# Patient Record
Sex: Female | Born: 1990 | Race: Black or African American | Hispanic: No | Marital: Single | Smoking: Never smoker
Health system: Southern US, Community
[De-identification: ages and names within clinical notes are randomized; demographics above are authoritative.]

## PROBLEM LIST (undated history)

## (undated) ENCOUNTER — Inpatient Hospital Stay (HOSPITAL_COMMUNITY): Payer: Self-pay

## (undated) DIAGNOSIS — J45909 Unspecified asthma, uncomplicated: Secondary | ICD-10-CM

## (undated) HISTORY — PX: NO PAST SURGERIES: SHX2092

---

## 2012-09-05 ENCOUNTER — Encounter (HOSPITAL_COMMUNITY): Payer: Self-pay | Admitting: Emergency Medicine

## 2012-09-05 ENCOUNTER — Emergency Department (HOSPITAL_COMMUNITY)
Admission: EM | Admit: 2012-09-05 | Discharge: 2012-09-05 | Disposition: A | Discharge status: Home / Self Care | Payer: Self-pay | Attending: Emergency Medicine | Admitting: Emergency Medicine

## 2012-09-05 DIAGNOSIS — Z760 Encounter for issue of repeat prescription: Secondary | ICD-10-CM | POA: Insufficient documentation

## 2012-09-05 DIAGNOSIS — Z888 Allergy status to other drugs, medicaments and biological substances status: Secondary | ICD-10-CM | POA: Insufficient documentation

## 2012-09-05 DIAGNOSIS — J45909 Unspecified asthma, uncomplicated: Secondary | ICD-10-CM | POA: Insufficient documentation

## 2012-09-05 HISTORY — DX: Unspecified asthma, uncomplicated: J45.909

## 2012-09-05 MED ORDER — BUDESONIDE 180 MCG/ACT IN AEPB: 1.0000 | INHALATION_SPRAY | Freq: Two times a day (BID) | RESPIRATORY_TRACT | Status: DC

## 2012-09-05 MED ORDER — ALBUTEROL SULFATE HFA 108 (90 BASE) MCG/ACT IN AERS: 2.0000 | INHALATION_SPRAY | Freq: Four times a day (QID) | RESPIRATORY_TRACT | Status: DC | PRN

## 2012-09-05 NOTE — ED Provider Notes (Signed)
History    This chart was scribed for [employer]. Rubin Payor, MD, MD by Smitty Pluck. The patient was seen in room TR10C and the patient's care was started at 9:17AM.   CSN: [id number]  Arrival date & time 09/05/12  0847      Chief Complaint  Patient presents with  . Asthma  . Medication Refill    (Consider location/radiation/quality/duration/timing/severity/associated sxs/prior treatment) Patient is a 21 y.o. female presenting with asthma. The history is provided by the patient. No language interpreter was used.  Asthma Pertinent negatives include no shortness of breath.   Tracy Stone is a 21 y.o. female who presents to the Emergency Department complaining of moderate asthma attack onset 2 months ago. Pt reports that she has had asthma attacks every morning. She states that she lost her albuterol inhaler 2 days ago and she needs a refill. Pt has used flovent without relief. Pt denies fever, n/v/d, cough and any other pain.   Past Medical History  Diagnosis Date  . Asthma     History reviewed. No pertinent past surgical history.  No family history on file.  History  Substance Use Topics  . Smoking status: Passive Smoke Exposure - Never Smoker  . Smokeless tobacco: Not on file  . Alcohol Use: No    OB History    Grav Para Term Preterm Abortions TAB SAB Ect Mult Living                  Review of Systems  Constitutional: Negative for fever and chills.  Respiratory: Negative for shortness of breath.   Gastrointestinal: Negative for nausea and vomiting.  Neurological: Negative for weakness.    Allergies  Ibuprofen  Home Medications   Current Outpatient Rx  Name Route Sig Dispense Refill  . ALBUTEROL SULFATE HFA 108 (90 BASE) MCG/ACT IN AERS Inhalation Inhale 2 puffs into the lungs every 6 (six) hours as needed. For shortness of breath 6.7 g 0  . BUDESONIDE 180 MCG/ACT IN AEPB Inhalation Inhale 1 puff into the lungs 2 (two) times daily. 1 Inhaler 0    BP  129/78  Pulse 90  Temp 98.3 F (36.8 C) (Oral)  Resp 18  SpO2 97%  LMP 08/12/2012  Physical Exam  Nursing note and vitals reviewed. Constitutional: She is oriented to person, place, and time. She appears well-developed and well-nourished. No distress.  HENT:  Head: Normocephalic and atraumatic.  Eyes: EOM are normal.  Neck: Neck supple. No tracheal deviation present.  Cardiovascular: Normal rate.   Pulmonary/Chest: Effort normal and breath sounds normal. No respiratory distress. She has no wheezes. She has no rales.  Musculoskeletal: Normal range of motion.  Neurological: She is alert and oriented to person, place, and time.  Skin: Skin is warm and dry.  Psychiatric: She has a normal mood and affect. Her behavior is normal.    ED Course  Procedures (including critical care time)   COORDINATION OF CARE: 9:20 AM Discussed ED treatment with pt     Labs Reviewed - No data to display No results found.   1. Asthma   2. Medication refill       MDM  Patient with shortness of breath. States she has daily asthma attacks and is out of her albuterol. Lungs are clear now. Doubt severe infection. She was on Flovent, however she is poorly controlled  Pulmicort has been added. She'll be discharged home   I personally performed the services described in this documentation, which was scribed in  my presence. The recorded information has been reviewed and considered.  Juliet Rude. Rubin Payor, MD 09/05/12 1026

## 2012-09-05 NOTE — ED Notes (Signed)
Received pt via EMS, Pt reports 2 month history of waking up everyday having an asthma attack. Pt reports uses inhaler at home that relieves the attack. Pt lost her inhaler 2 days ago and is requesting a prescription for another one. Pt talking in complete sentences without difficulty. Skin w/d color good. Lung sounds clear. Pt given albuterol neb treatment by EMS PTA.

## 2012-11-30 NOTE — L&D Delivery Note (Signed)
Delivery Note At 9:14 PM a viable female was delivered via Vaginal, Spontaneous Delivery (Presentation: Right Occiput Posterior).  APGAR: 8, 9; weight pending.   Placenta status: Intact, Spontaneous.  Cord: 3 vessels with the following complications: None.  Cord pH: n/a  Anesthesia: Epidural  Episiotomy: None Lacerations: None Suture Repair: n/a Est. Blood Loss (mL): 500  Mom to postpartum.  Baby to nursery-stable.  Napoleon Form 06/28/2013, 9:54 PM

## 2013-01-04 ENCOUNTER — Emergency Department (HOSPITAL_COMMUNITY)
Admission: EM | Admit: 2013-01-04 | Discharge: 2013-01-04 | Disposition: A | Discharge status: Home / Self Care | Payer: Self-pay | Attending: Emergency Medicine | Admitting: Emergency Medicine

## 2013-01-04 ENCOUNTER — Encounter (HOSPITAL_COMMUNITY): Payer: Self-pay | Admitting: Emergency Medicine

## 2013-01-04 DIAGNOSIS — J45909 Unspecified asthma, uncomplicated: Secondary | ICD-10-CM | POA: Insufficient documentation

## 2013-01-04 DIAGNOSIS — K0889 Other specified disorders of teeth and supporting structures: Secondary | ICD-10-CM

## 2013-01-04 DIAGNOSIS — Z79899 Other long term (current) drug therapy: Secondary | ICD-10-CM | POA: Insufficient documentation

## 2013-01-04 DIAGNOSIS — K089 Disorder of teeth and supporting structures, unspecified: Secondary | ICD-10-CM | POA: Insufficient documentation

## 2013-01-04 DIAGNOSIS — H9209 Otalgia, unspecified ear: Secondary | ICD-10-CM | POA: Insufficient documentation

## 2013-01-04 DIAGNOSIS — Z760 Encounter for issue of repeat prescription: Secondary | ICD-10-CM | POA: Insufficient documentation

## 2013-01-04 MED ORDER — TRAMADOL HCL 50 MG PO TABS: 50.0000 mg | ORAL_TABLET | Freq: Four times a day (QID) | ORAL | Status: DC | PRN

## 2013-01-04 MED ORDER — ALBUTEROL SULFATE HFA 108 (90 BASE) MCG/ACT IN AERS
2.0000 | INHALATION_SPRAY | RESPIRATORY_TRACT | Status: DC | PRN
  Administered 2013-01-04: 2 via RESPIRATORY_TRACT
  Filled 2013-01-04: qty 6.7

## 2013-01-04 MED ORDER — PENICILLIN V POTASSIUM 500 MG PO TABS: 500.0000 mg | ORAL_TABLET | Freq: Three times a day (TID) | ORAL | Status: DC

## 2013-01-04 NOTE — ED Provider Notes (Signed)
History     CSN: [id number]  Arrival date & time 01/04/13  0948   First MD Initiated Contact with Patient 01/04/13 1007      Chief Complaint  Patient presents with  . Facial Pain  . Otalgia    (Consider location/radiation/quality/duration/timing/severity/associated sxs/prior treatment) HPI  22 year old female presents complaining of facial pain. Patient reports for the past week she has gradual onset of pain to the left side of her face radiates up to her ear. Describe pain as an achy throbbing sensation, constant, sometimes affecting her teeth can sometimes affect the cheek. Pain is of mild to moderate in severity, not improved with taking Tylenol. She remains the pain worse. Patient denies fever, runny nose, sneezing, hearing changes, sore throat, trouble swallowing, chest pain, shortness of breath. There has been no recent trauma. Patient does have a history of asthma and takes albuterol inhaler as needed. Reports that she has been without asthma medication for a week and request for a refill.     Past Medical History  Diagnosis Date  . Asthma     History reviewed. No pertinent past surgical history.  No family history on file.  History  Substance Use Topics  . Smoking status: Passive Smoke Exposure - Never Smoker  . Smokeless tobacco: Not on file  . Alcohol Use: No    OB History    Grav Para Term Preterm Abortions TAB SAB Ect Mult Living                  Review of Systems  Constitutional:       10 Systems reviewed and all are negative for acute change except as noted in the HPI.     Allergies  Ibuprofen  Home Medications   Current Outpatient Rx  Name  Route  Sig  Dispense  Refill  . ACETAMINOPHEN 500 MG PO TABS   Oral   Take 1,000 mg by mouth every 6 (six) hours as needed. For pain.         . ALBUTEROL SULFATE HFA 108 (90 BASE) MCG/ACT IN AERS   Inhalation   Inhale 2 puffs into the lungs every 6 (six) hours as needed. For shortness of breath   6.7  g   0     BP 123/79  Pulse 90  Temp 98.2 F (36.8 C) (Oral)  Resp 16  SpO2 96%  Physical Exam  Nursing note and vitals reviewed. Constitutional: She appears well-developed and well-nourished. No distress.  HENT:  Head: Atraumatic.  Right Ear: External ear normal.  Left Ear: External ear normal.  Nose: Nose normal.  Mouth/Throat: Oropharynx is clear and moist. No oropharyngeal exudate.       Mouth:  Mild tenderness noted to second premolar on the left mandibular region with small cavitation.  No obvious abscess or gingivitis noted.  No evidence of peritonsillar abscess, Ludwig's angina or other evidence of deep tissue infection.  No trismus or rash.   Eyes: Conjunctivae normal are normal.  Neck: Normal range of motion. Neck supple.  Cardiovascular: Normal rate and regular rhythm.   Pulmonary/Chest: Effort normal and breath sounds normal. No respiratory distress. She has no wheezes.  Lymphadenopathy:    She has no cervical adenopathy.  Neurological: She is alert.  Skin: Skin is warm. No rash noted.  Psychiatric: She has a normal mood and affect.    ED Course  Procedures (including critical care time)  Labs Reviewed - No data to display No results found.  No diagnosis found.  10:27 AM Patient was seen and evaluate by me for facial pain. Pain appears to be related to dental pain. No evidence of deep tissue infection. Patient appears nontoxic. Patient doesn't have symptoms suggestive of upper respiratory infection. Plan to give patient pain medication, antibiotic, and dentist referral.  Patient does have history of asthma and is now off her inhaler. Albuterol inhaler were given here in the ER. Resource given so with patient to follow up with her primary care provider for further management. Return precautions given.   BP 123/79  Pulse 90  Temp 98.2 F (36.8 C) (Oral)  Resp 16  SpO2 96%  1. Dental pain 2. Medication refill  MDM          Fayrene Helper,  PA-C 01/04/13 1028

## 2013-01-04 NOTE — ED Provider Notes (Signed)
Medical screening examination/treatment/procedure(s) were performed by non-physician practitioner and as supervising physician I was immediately available for consultation/collaboration.   Carleene Cooper III, MD 01/04/13 [phone]

## 2013-01-04 NOTE — ED Notes (Signed)
Onset one week ago pain left face radiating to left ear denies trauma.  Airway intact bilateral equal chest rise and fall.

## 2013-01-31 ENCOUNTER — Encounter (HOSPITAL_COMMUNITY): Payer: Self-pay | Admitting: Emergency Medicine

## 2013-01-31 DIAGNOSIS — Z3201 Encounter for pregnancy test, result positive: Secondary | ICD-10-CM | POA: Insufficient documentation

## 2013-01-31 DIAGNOSIS — J45909 Unspecified asthma, uncomplicated: Secondary | ICD-10-CM | POA: Insufficient documentation

## 2013-01-31 DIAGNOSIS — O9989 Other specified diseases and conditions complicating pregnancy, childbirth and the puerperium: Secondary | ICD-10-CM | POA: Insufficient documentation

## 2013-01-31 DIAGNOSIS — R109 Unspecified abdominal pain: Secondary | ICD-10-CM | POA: Insufficient documentation

## 2013-01-31 LAB — CBC WITH DIFFERENTIAL/PLATELET
Basophils Absolute: 0 10*3/uL (ref 0.0–0.1)
Basophils Relative: 0 % (ref 0–1)
Eosinophils Absolute: 0.2 10*3/uL (ref 0.0–0.7)
Eosinophils Relative: 2 % (ref 0–5)
HCT: 34.4 % — ABNORMAL LOW (ref 36.0–46.0)
Hemoglobin: 11.9 g/dL — ABNORMAL LOW (ref 12.0–15.0)
Lymphocytes Relative: 29 % (ref 12–46)
Lymphs Abs: 3 10*3/uL (ref 0.7–4.0)
MCH: 22 pg — ABNORMAL LOW (ref 26.0–34.0)
MCHC: 34.6 g/dL (ref 30.0–36.0)
MCV: 63.7 fL — ABNORMAL LOW (ref 78.0–100.0)
Monocytes Absolute: 0.5 10*3/uL (ref 0.1–1.0)
Monocytes Relative: 5 % (ref 3–12)
Neutro Abs: 6.5 10*3/uL (ref 1.7–7.7)
Neutrophils Relative %: 64 % (ref 43–77)
Platelets: 292 10*3/uL (ref 150–400)
RBC: 5.4 MIL/uL — ABNORMAL HIGH (ref 3.87–5.11)
RDW: 15 % (ref 11.5–15.5)
WBC: 10.2 10*3/uL (ref 4.0–10.5)

## 2013-01-31 LAB — COMPREHENSIVE METABOLIC PANEL
AST: 15 U/L (ref 0–37)
Albumin: 3.8 g/dL (ref 3.5–5.2)
Calcium: 9.6 mg/dL (ref 8.4–10.5)
Chloride: 105 mEq/L (ref 96–112)
Creatinine, Ser: 0.69 mg/dL (ref 0.50–1.10)
Total Bilirubin: 0.2 mg/dL — ABNORMAL LOW (ref 0.3–1.2)
Total Protein: 7.9 g/dL (ref 6.0–8.3)

## 2013-01-31 LAB — URINALYSIS, ROUTINE W REFLEX MICROSCOPIC
Bilirubin Urine: NEGATIVE
Glucose, UA: NEGATIVE mg/dL
Hgb urine dipstick: NEGATIVE
Ketones, ur: NEGATIVE mg/dL
Leukocytes, UA: NEGATIVE
Nitrite: NEGATIVE
Protein, ur: NEGATIVE mg/dL
Specific Gravity, Urine: 1.01 (ref 1.005–1.030)
Urobilinogen, UA: 0.2 mg/dL (ref 0.0–1.0)
pH: 5.5 (ref 5.0–8.0)

## 2013-01-31 NOTE — ED Notes (Signed)
Pt c/o RLQ pain onset 3 hrs ago.  Denies nausea, vomiting or urinary symptoms.  Pt also denies vag. discharge

## 2013-02-01 ENCOUNTER — Emergency Department (HOSPITAL_COMMUNITY): Payer: Medicaid Other

## 2013-02-01 ENCOUNTER — Emergency Department (HOSPITAL_COMMUNITY)
Admission: EM | Admit: 2013-02-01 | Discharge: 2013-02-01 | Disposition: A | Discharge status: Home / Self Care | Payer: Medicaid Other | Attending: Emergency Medicine | Admitting: Emergency Medicine

## 2013-02-01 DIAGNOSIS — R109 Unspecified abdominal pain: Secondary | ICD-10-CM

## 2013-02-01 LAB — HCG, QUANTITATIVE, PREGNANCY: hCG, Beta Chain, Quant, S: 15017 m[IU]/mL — ABNORMAL HIGH (ref <5)

## 2013-02-01 MED ORDER — ALBUTEROL SULFATE HFA 108 (90 BASE) MCG/ACT IN AERS
1.0000 | INHALATION_SPRAY | RESPIRATORY_TRACT | Status: DC | PRN
  Administered 2013-02-01: 2 via RESPIRATORY_TRACT
  Filled 2013-02-01: qty 6.7

## 2013-02-01 NOTE — ED Provider Notes (Signed)
History     CSN: [id number]  Arrival date & time 01/31/13  2142   First MD Initiated Contact with Patient 02/01/13 0101      Chief Complaint  Patient presents with  . Abdominal Pain    (Consider location/radiation/quality/duration/timing/severity/associated sxs/prior treatment) HPI Comments: Patient presents with a several hour history of right lower abdominal pain that is crampy in nature.  She denies any fevers or chills.  No nausea, vomiting, or diarrhea.  No vaginal bleeding or discharge.  She has not had a period in several months and recall when the last was.  She admits to the possibility of pregnant.  Patient is a 22 y.o. female presenting with abdominal pain. The history is provided by the patient.  Abdominal Pain Pain location:  RUQ and RLQ Pain quality: cramping   Pain radiates to:  Does not radiate Onset quality:  Sudden Duration:  3 hours Timing:  Constant Progression:  Worsening Chronicity:  New Relieved by:  Nothing Worsened by:  Nothing tried Ineffective treatments:  None tried   Past Medical History  Diagnosis Date  . Asthma     History reviewed. No pertinent past surgical history.  No family history on file.  History  Substance Use Topics  . Smoking status: Passive Smoke Exposure - Never Smoker  . Smokeless tobacco: Not on file  . Alcohol Use: No    OB History   Grav Para Term Preterm Abortions TAB SAB Ect Mult Living                  Review of Systems  Gastrointestinal: Positive for abdominal pain.  All other systems reviewed and are negative.    Allergies  Ibuprofen  Home Medications   Current Outpatient Rx  Name  Route  Sig  Dispense  Refill  . acetaminophen (TYLENOL) 500 MG tablet   Oral   Take 1,000 mg by mouth every 6 (six) hours as needed. For pain.         Marland Kitchen albuterol (PROVENTIL HFA;VENTOLIN HFA) 108 (90 BASE) MCG/ACT inhaler   Inhalation   Inhale 2 puffs into the lungs every 6 (six) hours as needed. For shortness of  breath   6.7 g   0     BP 126/85  Pulse 92  Temp(Src) 98.3 F (36.8 C) (Oral)  Resp 16  SpO2 100%  LMP 11/02/2012  Physical Exam  Nursing note and vitals reviewed. Constitutional: She is oriented to person, place, and time. She appears well-developed and well-nourished. No distress.  HENT:  Head: Normocephalic and atraumatic.  Neck: Normal range of motion. Neck supple.  Cardiovascular: Normal rate and regular rhythm.  Exam reveals no gallop and no friction rub.   No murmur heard. Pulmonary/Chest: Effort normal and breath sounds normal. No respiratory distress. She has no wheezes.  Abdominal: Soft. Bowel sounds are normal. She exhibits no distension.  There is ttp in the right lower quadrant and right mid abdomen.  There is no rebound or guarding.    Musculoskeletal: Normal range of motion.  Neurological: She is alert and oriented to person, place, and time.  Skin: Skin is warm and dry. She is not diaphoretic.    ED Course  Procedures (including critical care time)  Labs Reviewed  CBC WITH DIFFERENTIAL - Abnormal; Notable for the following:    RBC 5.40 (*)    Hemoglobin 11.9 (*)    HCT 34.4 (*)    MCV 63.7 (*)    MCH 22.0 (*)  All other components within normal limits  COMPREHENSIVE METABOLIC PANEL - Abnormal; Notable for the following:    Total Bilirubin 0.2 (*)    All other components within normal limits  POCT PREGNANCY, URINE - Abnormal; Notable for the following:    Preg Test, Ur POSITIVE (*)    All other components within normal limits  URINALYSIS, ROUTINE W REFLEX MICROSCOPIC  HCG, QUANTITATIVE, PREGNANCY   No results found.   No diagnosis found.    MDM  The patient presents here with right lower abdominal pain that started several hours pta.  She reports to me that she has not had a period in several months.  The labs here reveal a positive pregnancy test and quantitative bhcg that was 15k.  She underwent an ultrasound which shows an iup at about [redacted]  weeks gestation.  There was no other cause of her discomfort identified.  I suspect this is round ligament pain and have considered, but doubt appendicitis.  I had planned to perform a pelvic examination as well, however the patient had to leave prior to this being performed.  She wants to have this done by ob who I have recommended she follow up with.  She is to return or go to [facility] for any further problems.         Geoffery Lyons, MD 02/01/13 [phone]

## 2013-03-08 ENCOUNTER — Ambulatory Visit (INDEPENDENT_AMBULATORY_CARE_PROVIDER_SITE_OTHER): Payer: Medicaid Other | Admitting: Advanced Practice Midwife

## 2013-03-08 ENCOUNTER — Other Ambulatory Visit: Payer: Self-pay | Admitting: Family Medicine

## 2013-03-08 ENCOUNTER — Encounter: Payer: Self-pay | Admitting: Advanced Practice Midwife

## 2013-03-08 VITALS — BP 139/84 | Temp 98.4°F | Ht 63.0 in | Wt 207.3 lb

## 2013-03-08 DIAGNOSIS — O99519 Diseases of the respiratory system complicating pregnancy, unspecified trimester: Secondary | ICD-10-CM | POA: Insufficient documentation

## 2013-03-08 DIAGNOSIS — O0932 Supervision of pregnancy with insufficient antenatal care, second trimester: Secondary | ICD-10-CM

## 2013-03-08 DIAGNOSIS — Z3400 Encounter for supervision of normal first pregnancy, unspecified trimester: Secondary | ICD-10-CM | POA: Insufficient documentation

## 2013-03-08 DIAGNOSIS — J45909 Unspecified asthma, uncomplicated: Secondary | ICD-10-CM

## 2013-03-08 DIAGNOSIS — Z3402 Encounter for supervision of normal first pregnancy, second trimester: Secondary | ICD-10-CM

## 2013-03-08 DIAGNOSIS — O0930 Supervision of pregnancy with insufficient antenatal care, unspecified trimester: Secondary | ICD-10-CM | POA: Insufficient documentation

## 2013-03-08 LAB — POCT URINALYSIS DIP (DEVICE)
Bilirubin Urine: NEGATIVE
Glucose, UA: NEGATIVE mg/dL
Ketones, ur: NEGATIVE mg/dL
Specific Gravity, Urine: 1.02 (ref 1.005–1.030)

## 2013-03-08 NOTE — Patient Instructions (Addendum)
Pregnancy - Second Trimester The second trimester of pregnancy (3 to 6 months) is a period of rapid growth for you and your baby. At the end of the sixth month, your baby is about 9 inches long and weighs 1 1/2 pounds. You will begin to feel the baby move between 18 and 20 weeks of the pregnancy. This is called quickening. Weight gain is faster. A clear fluid (colostrum) may leak out of your breasts. You may feel small contractions of the womb (uterus). This is known as false labor or Braxton-Hicks contractions. This is like a practice for labor when the baby is ready to be born. Usually, the problems with morning sickness have usually passed by the end of your first trimester. Some women develop small dark blotches (called cholasma, mask of pregnancy) on their face that usually goes away after the baby is born. Exposure to the sun makes the blotches worse. Acne may also develop in some pregnant women and pregnant women who have acne, may find that it goes away. PRENATAL EXAMS  Blood work may continue to be done during prenatal exams. These tests are done to check on your health and the probable health of your baby. Blood work is used to follow your blood levels (hemoglobin). Anemia (low hemoglobin) is common during pregnancy. Iron and vitamins are given to help prevent this. You will also be checked for diabetes between 24 and 28 weeks of the pregnancy. Some of the previous blood tests may be repeated.  The size of the uterus is measured during each visit. This is to make sure that the baby is continuing to grow properly according to the dates of the pregnancy.  Your blood pressure is checked every prenatal visit. This is to make sure you are not getting toxemia.  Your urine is checked to make sure you do not have an infection, diabetes or protein in the urine.  Your weight is checked often to make sure gains are happening at the suggested rate. This is to ensure that both you and your baby are growing  normally.  Sometimes, an ultrasound is performed to confirm the proper growth and development of the baby. This is a test which bounces harmless sound waves off the baby so your caregiver can more accurately determine due dates. Sometimes, a specialized test is done on the amniotic fluid surrounding the baby. This test is called an amniocentesis. The amniotic fluid is obtained by sticking a needle into the belly (abdomen). This is done to check the chromosomes in instances where there is a concern about possible genetic problems with the baby. It is also sometimes done near the end of pregnancy if an early delivery is required. In this case, it is done to help make sure the baby's lungs are mature enough for the baby to live outside of the womb. CHANGES OCCURING IN THE SECOND TRIMESTER OF PREGNANCY Your body goes through many changes during pregnancy. They vary from person to person. Talk to your caregiver about changes you notice that you are concerned about.  During the second trimester, you will likely have an increase in your appetite. It is normal to have cravings for certain foods. This varies from person to person and pregnancy to pregnancy.  Your lower abdomen will begin to bulge.  You may have to urinate more often because the uterus and baby are pressing on your bladder. It is also common to get more bladder infections during pregnancy (pain with urination). You can help this by   drinking lots of fluids and emptying your bladder before and after intercourse.  You may begin to get stretch marks on your hips, abdomen, and breasts. These are normal changes in the body during pregnancy. There are no exercises or medications to take that prevent this change.  You may begin to develop swollen and bulging veins (varicose veins) in your legs. Wearing support hose, elevating your feet for 15 minutes, 3 to 4 times a day and limiting salt in your diet helps lessen the problem.  Heartburn may develop  as the uterus grows and pushes up against the stomach. Antacids recommended by your caregiver helps with this problem. Also, eating smaller meals 4 to 5 times a day helps.  Constipation can be treated with a stool softener or adding bulk to your diet. Drinking lots of fluids, vegetables, fruits, and whole grains are helpful.  Exercising is also helpful. If you have been very active up until your pregnancy, most of these activities can be continued during your pregnancy. If you have been less active, it is helpful to start an exercise program such as walking.  Hemorrhoids (varicose veins in the rectum) may develop at the end of the second trimester. Warm sitz baths and hemorrhoid cream recommended by your caregiver helps hemorrhoid problems.  Backaches may develop during this time of your pregnancy. Avoid heavy lifting, wear low heal shoes and practice good posture to help with backache problems.  Some pregnant women develop tingling and numbness of their hand and fingers because of swelling and tightening of ligaments in the wrist (carpel tunnel syndrome). This goes away after the baby is born.  As your breasts enlarge, you may have to get a bigger bra. Get a comfortable, cotton, support bra. Do not get a nursing bra until the last month of the pregnancy if you will be nursing the baby.  You may get a dark line from your belly button to the pubic area called the linea nigra.  You may develop rosy cheeks because of increase blood flow to the face.  You may develop spider looking lines of the face, neck, arms and chest. These go away after the baby is born. HOME CARE INSTRUCTIONS   It is extremely important to avoid all smoking, herbs, alcohol, and unprescribed drugs during your pregnancy. These chemicals affect the formation and growth of the baby. Avoid these chemicals throughout the pregnancy to ensure the delivery of a healthy infant.  Most of your home care instructions are the same as  suggested for the first trimester of your pregnancy. Keep your caregiver's appointments. Follow your caregiver's instructions regarding medication use, exercise and diet.  During pregnancy, you are providing food for you and your baby. Continue to eat regular, well-balanced meals. Choose foods such as meat, fish, milk and other low fat dairy products, vegetables, fruits, and whole-grain breads and cereals. Your caregiver will tell you of the ideal weight gain.  A physical sexual relationship may be continued up until near the end of pregnancy if there are no other problems. Problems could include early (premature) leaking of amniotic fluid from the membranes, vaginal bleeding, abdominal pain, or other medical or pregnancy problems.  Exercise regularly if there are no restrictions. Check with your caregiver if you are unsure of the safety of some of your exercises. The greatest weight gain will occur in the last 2 trimesters of pregnancy. Exercise will help you:  Control your weight.  Get you in shape for labor and delivery.  Lose weight   after you have the baby.  Wear a good support or jogging bra for breast tenderness during pregnancy. This may help if worn during sleep. Pads or tissues may be used in the bra if you are leaking colostrum.  Do not use hot tubs, steam rooms or saunas throughout the pregnancy.  Wear your seat belt at all times when driving. This protects you and your baby if you are in an accident.  Avoid raw meat, uncooked cheese, cat litter boxes and soil used by cats. These carry germs that can cause birth defects in the baby.  The second trimester is also a good time to visit your dentist for your dental health if this has not been done yet. Getting your teeth cleaned is OK. Use a soft toothbrush. Brush gently during pregnancy.  It is easier to loose urine during pregnancy. Tightening up and strengthening the pelvic muscles will help with this problem. Practice stopping your  urination while you are going to the bathroom. These are the same muscles you need to strengthen. It is also the muscles you would use as if you were trying to stop from passing gas. You can practice tightening these muscles up 10 times a set and repeating this about 3 times per day. Once you know what muscles to tighten up, do not perform these exercises during urination. It is more likely to contribute to an infection by backing up the urine.  Ask for help if you have financial, counseling or nutritional needs during pregnancy. Your caregiver will be able to offer counseling for these needs as well as refer you for other special needs.  Your skin may become oily. If so, wash your face with mild soap, use non-greasy moisturizer and oil or cream based makeup. MEDICATIONS AND DRUG USE IN PREGNANCY  Take prenatal vitamins as directed. The vitamin should contain 1 milligram of folic acid. Keep all vitamins out of reach of children. Only a couple vitamins or tablets containing iron may be fatal to a baby or young child when ingested.  Avoid use of all medications, including herbs, over-the-counter medications, not prescribed or suggested by your caregiver. Only take over-the-counter or prescription medicines for pain, discomfort, or fever as directed by your caregiver. Do not use aspirin.  Let your caregiver also know about herbs you may be using.  Alcohol is related to a number of birth defects. This includes fetal alcohol syndrome. All alcohol, in any form, should be avoided completely. Smoking will cause low birth rate and premature babies.  Street or illegal drugs are very harmful to the baby. They are absolutely forbidden. A baby born to an addicted mother will be addicted at birth. The baby will go through the same withdrawal an adult does. SEEK MEDICAL CARE IF:  You have any concerns or worries during your pregnancy. It is better to call with your questions if you feel they cannot wait, rather  than worry about them. SEEK IMMEDIATE MEDICAL CARE IF:   An unexplained oral temperature above 102 F (38.9 C) develops, or as your caregiver suggests.  You have leaking of fluid from the vagina (birth canal). If leaking membranes are suspected, take your temperature and tell your caregiver of this when you call.  There is vaginal spotting, bleeding, or passing clots. Tell your caregiver of the amount and how many pads are used. Light spotting in pregnancy is common, especially following intercourse.  You develop a bad smelling vaginal discharge with a change in the color from clear   to white.  You continue to feel sick to your stomach (nauseated) and have no relief from remedies suggested. You vomit blood or coffee ground-like materials.  You lose more than 2 pounds of weight or gain more than 2 pounds of weight over 1 week, or as suggested by your caregiver.  You notice swelling of your face, hands, feet, or legs.  You get exposed to German measles and have never had them.  You are exposed to fifth disease or chickenpox.  You develop belly (abdominal) pain. Round ligament discomfort is a common non-cancerous (benign) cause of abdominal pain in pregnancy. Your caregiver still must evaluate you.  You develop a bad headache that does not go away.  You develop fever, diarrhea, pain with urination, or shortness of breath.  You develop visual problems, blurry, or double vision.  You fall or are in a car accident or any kind of trauma.  There is mental or physical violence at home. Document Released: 11/10/2001 Document Revised: 02/08/2012 Document Reviewed: 05/15/2009 [employer] Patient Information 2013 [employer].  

## 2013-03-08 NOTE — Progress Notes (Signed)
Presents for new OB visit. G1P0. Doing well. States feeling lots of movement. See flow sheet for other details. Prenatal labs today. 1 hr early glucola. GC/chlamydia collected. Will schedule for OB US. F/u in 4 weeks with repeat glucola.

## 2013-03-08 NOTE — Progress Notes (Signed)
Pulse- 111 Patient reports pain in upper back/shoulder after eating.

## 2013-03-08 NOTE — Progress Notes (Signed)
I saw and examined patient along with student and agree with above note. New OB visit, late to care. Uncomplicated medical history. OB labs and early glucola done today. Precautions rev'd.   Tracy Stone 03/08/2013 11:32 AM

## 2013-03-09 LAB — OBSTETRIC PANEL
Antibody Screen: NEGATIVE
Eosinophils Relative: 3 % (ref 0–5)
HCT: 30.8 % — ABNORMAL LOW (ref 36.0–46.0)
Hemoglobin: 10.1 g/dL — ABNORMAL LOW (ref 12.0–15.0)
Lymphocytes Relative: 23 % (ref 12–46)
Lymphs Abs: 2.1 10*3/uL (ref 0.7–4.0)
MCV: 68.3 fL — ABNORMAL LOW (ref 78.0–100.0)
Monocytes Absolute: 0.3 10*3/uL (ref 0.1–1.0)
RBC: 4.51 MIL/uL (ref 3.87–5.11)
Rubella: 2.37 Index — ABNORMAL HIGH (ref <0.90)
WBC: 9.1 10*3/uL (ref 4.0–10.5)

## 2013-03-09 LAB — GLUCOSE TOLERANCE, 1 HOUR (50G) W/O FASTING: Glucose, 1 Hour GTT: 84 mg/dL (ref 70–140)

## 2013-03-09 LAB — HIV ANTIBODY (ROUTINE TESTING W REFLEX): HIV: NONREACTIVE

## 2013-03-10 LAB — CULTURE, OB URINE: Colony Count: >=100000

## 2013-03-10 LAB — HEMOGLOBINOPATHY EVALUATION: Hgb S Quant: 0 %

## 2013-03-10 LAB — GC/CHLAMYDIA PROBE AMP: CT Probe RNA: POSITIVE — AB

## 2013-03-13 ENCOUNTER — Ambulatory Visit (HOSPITAL_COMMUNITY)
Admission: RE | Admit: 2013-03-13 | Discharge: 2013-03-13 | Disposition: A | Discharge status: Home / Self Care | Payer: Medicaid Other | Source: Ambulatory Visit | Attending: Advanced Practice Midwife | Admitting: Advanced Practice Midwife

## 2013-03-13 ENCOUNTER — Encounter: Payer: Self-pay | Admitting: Advanced Practice Midwife

## 2013-03-13 ENCOUNTER — Telehealth: Payer: Self-pay

## 2013-03-13 ENCOUNTER — Encounter (HOSPITAL_COMMUNITY): Payer: Self-pay

## 2013-03-13 ENCOUNTER — Encounter: Payer: Self-pay | Admitting: Obstetrics & Gynecology

## 2013-03-13 DIAGNOSIS — Z363 Encounter for antenatal screening for malformations: Secondary | ICD-10-CM | POA: Insufficient documentation

## 2013-03-13 DIAGNOSIS — O99519 Diseases of the respiratory system complicating pregnancy, unspecified trimester: Secondary | ICD-10-CM

## 2013-03-13 DIAGNOSIS — O0930 Supervision of pregnancy with insufficient antenatal care, unspecified trimester: Secondary | ICD-10-CM | POA: Insufficient documentation

## 2013-03-13 DIAGNOSIS — Z3402 Encounter for supervision of normal first pregnancy, second trimester: Secondary | ICD-10-CM

## 2013-03-13 DIAGNOSIS — A749 Chlamydial infection, unspecified: Secondary | ICD-10-CM | POA: Insufficient documentation

## 2013-03-13 DIAGNOSIS — O0932 Supervision of pregnancy with insufficient antenatal care, second trimester: Secondary | ICD-10-CM

## 2013-03-13 DIAGNOSIS — Z1389 Encounter for screening for other disorder: Secondary | ICD-10-CM | POA: Insufficient documentation

## 2013-03-13 DIAGNOSIS — O98819 Other maternal infectious and parasitic diseases complicating pregnancy, unspecified trimester: Secondary | ICD-10-CM

## 2013-03-13 DIAGNOSIS — O358XX0 Maternal care for other (suspected) fetal abnormality and damage, not applicable or unspecified: Secondary | ICD-10-CM | POA: Insufficient documentation

## 2013-03-13 DIAGNOSIS — J45909 Unspecified asthma, uncomplicated: Secondary | ICD-10-CM

## 2013-03-13 MED ORDER — AZITHROMYCIN 250 MG PO TABS: 250.0000 mg | ORAL_TABLET | Freq: Once | ORAL | Status: DC

## 2013-03-13 MED ORDER — AZITHROMYCIN 250 MG PO TABS: 1000.0000 mg | ORAL_TABLET | Freq: Once | ORAL | Status: DC

## 2013-03-13 NOTE — Telephone Encounter (Signed)
Called pt and informed pt of positive results and the need to get tx'd.  Rx sent to pharmacy and STD card sent [facility].  Advised pt to please have partner or partners tx'd as well and they go to the [facility] for tx.  Pt stated understanding and did not have any other questions.

## 2013-03-13 NOTE — Telephone Encounter (Signed)
Message copied by Faythe Casa on Mon Mar 13, 2013  2:33 PM ------      Message from: Archie Patten      Created: Mon Mar 13, 2013  2:29 PM       Please inform patient chlamydia positive - needs rx for azithromycin 1 gm PO x 1, partner needs treatment. ------

## 2013-03-15 LAB — HGB ELECTROPHORESIS REFLEXED REPORT
Hemoglobin A2 - HGBRFX: 3.6 % — ABNORMAL HIGH (ref 1.8–3.5)
Sickle Solubility Test - HGBRFX: NEGATIVE

## 2013-03-17 ENCOUNTER — Encounter: Payer: Self-pay | Admitting: Advanced Practice Midwife

## 2013-03-17 DIAGNOSIS — D582 Other hemoglobinopathies: Secondary | ICD-10-CM | POA: Insufficient documentation

## 2013-03-30 ENCOUNTER — Encounter: Payer: Self-pay | Admitting: *Deleted

## 2013-04-05 ENCOUNTER — Ambulatory Visit (INDEPENDENT_AMBULATORY_CARE_PROVIDER_SITE_OTHER): Payer: Medicaid Other | Admitting: Obstetrics and Gynecology

## 2013-04-05 VITALS — BP 134/83 | Temp 98.2°F | Wt 210.2 lb

## 2013-04-05 DIAGNOSIS — Z3402 Encounter for supervision of normal first pregnancy, second trimester: Secondary | ICD-10-CM

## 2013-04-05 DIAGNOSIS — J45909 Unspecified asthma, uncomplicated: Secondary | ICD-10-CM

## 2013-04-05 DIAGNOSIS — O99519 Diseases of the respiratory system complicating pregnancy, unspecified trimester: Secondary | ICD-10-CM

## 2013-04-05 LAB — POCT URINALYSIS DIP (DEVICE)
Bilirubin Urine: NEGATIVE
Nitrite: NEGATIVE
Protein, ur: NEGATIVE mg/dL
pH: 6 (ref 5.0–8.0)

## 2013-04-05 MED ORDER — ALBUTEROL SULFATE HFA 108 (90 BASE) MCG/ACT IN AERS: 2.0000 | INHALATION_SPRAY | Freq: Four times a day (QID) | RESPIRATORY_TRACT | Status: DC | PRN

## 2013-04-05 NOTE — Progress Notes (Signed)
Pulse- 117 Patient reports sharp pains in the back of her legs in the calf at night time as well as pain in her upper back/shoulder

## 2013-04-05 NOTE — Progress Notes (Signed)
Albuterol refilled. Has asthma and seasonal allergies. Rec: Zyrtec and notify if using inhaler more thatn 3-4 times/wk. Watch BP.

## 2013-04-05 NOTE — Patient Instructions (Signed)
Leg Cramps Leg cramps that occur during exercise can be caused by poor circulation or dehydration. However, muscle cramps that occur at rest or during the night are usually not due to any serious medical problem. Heat cramps may cause muscle spasms during hot weather.  CAUSES There is no clear cause for muscle cramps. However, dehydration may be a factor for those who do not drink enough fluids and those who exercise in the heat. Imbalances in the level of sodium, potassium, calcium or magnesium in the muscle tissue may also be a factor. Some medications, such as water pills (diuretics), may cause loss of chemicals that the body needs (like sodium and potassium) and cause muscle cramps. TREATMENT   Make sure your diet has enough fluids and essential minerals for the muscle to work normally.  Avoid strenuous exercise for several days if you have been having frequent leg cramps.  Stretch and massage the cramped muscle for several minutes.  Some medicines may be helpful in some patients with night cramps. Only take over-the-counter or prescription medicines as directed by your caregiver. SEEK IMMEDIATE MEDICAL CARE IF:   Your leg cramps become worse.  Your foot becomes cold, numb, or blue. Document Released: 12/24/2004 Document Revised: 02/08/2012 Document Reviewed: 12/11/2008 [facility] Patient Information 2013 [place], [place]. Pregnancy - Second Trimester The second trimester of pregnancy (3 to 6 months) is a period of rapid growth for you and your baby. At the end of the sixth month, your baby is about 9 inches long and weighs 1 1/2 pounds. You will begin to feel the baby move between 18 and 20 weeks of the pregnancy. This is called quickening. Weight gain is faster. A clear fluid (colostrum) may leak out of your breasts. You may feel small contractions of the womb (uterus). This is known as false labor or Braxton-Hicks contractions. This is like a practice for labor when the baby is ready to be  born. Usually, the problems with morning sickness have usually passed by the end of your first trimester. Some women develop small dark blotches (called cholasma, mask of pregnancy) on their face that usually goes away after the baby is born. Exposure to the sun makes the blotches worse. Acne may also develop in some pregnant women and pregnant women who have acne, may find that it goes away. PRENATAL EXAMS  Blood work may continue to be done during prenatal exams. These tests are done to check on your health and the probable health of your baby. Blood work is used to follow your blood levels (hemoglobin). Anemia (low hemoglobin) is common during pregnancy. Iron and vitamins are given to help prevent this. You will also be checked for diabetes between 24 and 28 weeks of the pregnancy. Some of the previous blood tests may be repeated.  The size of the uterus is measured during each visit. This is to make sure that the baby is continuing to grow properly according to the dates of the pregnancy.  Your blood pressure is checked every prenatal visit. This is to make sure you are not getting toxemia.  Your urine is checked to make sure you do not have an infection, diabetes or protein in the urine.  Your weight is checked often to make sure gains are happening at the suggested rate. This is to ensure that both you and your baby are growing normally.  Sometimes, an ultrasound is performed to confirm the proper growth and development of the baby. This is a test which bounces harmless  sound waves off the baby so your caregiver can more accurately determine due dates. Sometimes, a specialized test is done on the amniotic fluid surrounding the baby. This test is called an amniocentesis. The amniotic fluid is obtained by sticking a needle into the belly (abdomen). This is done to check the chromosomes in instances where there is a concern about possible genetic problems with the baby. It is also sometimes done  near the end of pregnancy if an early delivery is required. In this case, it is done to help make sure the baby's lungs are mature enough for the baby to live outside of the womb. CHANGES OCCURING IN THE SECOND TRIMESTER OF PREGNANCY Your body goes through many changes during pregnancy. They vary from person to person. Talk to your caregiver about changes you notice that you are concerned about.  During the second trimester, you will likely have an increase in your appetite. It is normal to have cravings for certain foods. This varies from person to person and pregnancy to pregnancy.  Your lower abdomen will begin to bulge.  You may have to urinate more often because the uterus and baby are pressing on your bladder. It is also common to get more bladder infections during pregnancy (pain with urination). You can help this by drinking lots of fluids and emptying your bladder before and after intercourse.  You may begin to get stretch marks on your hips, abdomen, and breasts. These are normal changes in the body during pregnancy. There are no exercises or medications to take that prevent this change.  You may begin to develop swollen and bulging veins (varicose veins) in your legs. Wearing support hose, elevating your feet for 15 minutes, 3 to 4 times a day and limiting salt in your diet helps lessen the problem.  Heartburn may develop as the uterus grows and pushes up against the stomach. Antacids recommended by your caregiver helps with this problem. Also, eating smaller meals 4 to 5 times a day helps.  Constipation can be treated with a stool softener or adding bulk to your diet. Drinking lots of fluids, vegetables, fruits, and whole grains are helpful.  Exercising is also helpful. If you have been very active up until your pregnancy, most of these activities can be continued during your pregnancy. If you have been less active, it is helpful to start an exercise program such as  walking.  Hemorrhoids (varicose veins in the rectum) may develop at the end of the second trimester. Warm sitz baths and hemorrhoid cream recommended by your caregiver helps hemorrhoid problems.  Backaches may develop during this time of your pregnancy. Avoid heavy lifting, wear low heal shoes and practice good posture to help with backache problems.  Some pregnant women develop tingling and numbness of their hand and fingers because of swelling and tightening of ligaments in the wrist (carpel tunnel syndrome). This goes away after the baby is born.  As your breasts enlarge, you may have to get a bigger bra. Get a comfortable, cotton, support bra. Do not get a nursing bra until the last month of the pregnancy if you will be nursing the baby.  You may get a dark line from your belly button to the pubic area called the linea nigra.  You may develop rosy cheeks because of increase blood flow to the face.  You may develop spider looking lines of the face, neck, arms and chest. These go away after the baby is born. HOME CARE INSTRUCTIONS  It is extremely important to avoid all smoking, herbs, alcohol, and unprescribed drugs during your pregnancy. These chemicals affect the formation and growth of the baby. Avoid these chemicals throughout the pregnancy to ensure the delivery of a healthy infant.  Most of your home care instructions are the same as suggested for the first trimester of your pregnancy. Keep your caregiver's appointments. Follow your caregiver's instructions regarding medication use, exercise and diet.  During pregnancy, you are providing food for you and your baby. Continue to eat regular, well-balanced meals. Choose foods such as meat, fish, milk and other low fat dairy products, vegetables, fruits, and whole-grain breads and cereals. Your caregiver will tell you of the ideal weight gain.  A physical sexual relationship may be continued up until near the end of pregnancy if there  are no other problems. Problems could include early (premature) leaking of amniotic fluid from the membranes, vaginal bleeding, abdominal pain, or other medical or pregnancy problems.  Exercise regularly if there are no restrictions. Check with your caregiver if you are unsure of the safety of some of your exercises. The greatest weight gain will occur in the last 2 trimesters of pregnancy. Exercise will help you:  Control your weight.  Get you in shape for labor and delivery.  Lose weight after you have the baby.  Wear a good support or jogging bra for breast tenderness during pregnancy. This may help if worn during sleep. Pads or tissues may be used in the bra if you are leaking colostrum.  Do not use hot tubs, steam rooms or saunas throughout the pregnancy.  Wear your seat belt at all times when driving. This protects you and your baby if you are in an accident.  Avoid raw meat, uncooked cheese, cat litter boxes and soil used by cats. These carry germs that can cause birth defects in the baby.  The second trimester is also a good time to visit your dentist for your dental health if this has not been done yet. Getting your teeth cleaned is OK. Use a soft toothbrush. Brush gently during pregnancy.  It is easier to loose urine during pregnancy. Tightening up and strengthening the pelvic muscles will help with this problem. Practice stopping your urination while you are going to the bathroom. These are the same muscles you need to strengthen. It is also the muscles you would use as if you were trying to stop from passing gas. You can practice tightening these muscles up 10 times a set and repeating this about 3 times per day. Once you know what muscles to tighten up, do not perform these exercises during urination. It is more likely to contribute to an infection by backing up the urine.  Ask for help if you have financial, counseling or nutritional needs during pregnancy. Your caregiver will be  able to offer counseling for these needs as well as refer you for other special needs.  Your skin may become oily. If so, wash your face with mild soap, use non-greasy moisturizer and oil or cream based makeup. MEDICATIONS AND DRUG USE IN PREGNANCY  Take prenatal vitamins as directed. The vitamin should contain 1 milligram of folic acid. Keep all vitamins out of reach of children. Only a couple vitamins or tablets containing iron may be fatal to a baby or young child when ingested.  Avoid use of all medications, including herbs, over-the-counter medications, not prescribed or suggested by your caregiver. Only take over-the-counter or prescription medicines for pain, discomfort, or fever  as directed by your caregiver. Do not use aspirin.  Let your caregiver also know about herbs you may be using.  Alcohol is related to a number of birth defects. This includes fetal alcohol syndrome. All alcohol, in any form, should be avoided completely. Smoking will cause low birth rate and premature babies.  Street or illegal drugs are very harmful to the baby. They are absolutely forbidden. A baby born to an addicted mother will be addicted at birth. The baby will go through the same withdrawal an adult does. SEEK MEDICAL CARE IF:  You have any concerns or worries during your pregnancy. It is better to call with your questions if you feel they cannot wait, rather than worry about them. SEEK IMMEDIATE MEDICAL CARE IF:   An unexplained oral temperature above 102 F (38.9 C) develops, or as your caregiver suggests.  You have leaking of fluid from the vagina (birth canal). If leaking membranes are suspected, take your temperature and tell your caregiver of this when you call.  There is vaginal spotting, bleeding, or passing clots. Tell your caregiver of the amount and how many pads are used. Light spotting in pregnancy is common, especially following intercourse.  You develop a bad smelling vaginal discharge  with a change in the color from clear to white.  You continue to feel sick to your stomach (nauseated) and have no relief from remedies suggested. You vomit blood or coffee ground-like materials.  You lose more than 2 pounds of weight or gain more than 2 pounds of weight over 1 week, or as suggested by your caregiver.  You notice swelling of your face, hands, feet, or legs.  You get exposed to [place] measles and have never had them.  You are exposed to fifth disease or chickenpox.  You develop belly (abdominal) pain. Round ligament discomfort is a common non-cancerous (benign) cause of abdominal pain in pregnancy. Your caregiver still must evaluate you.  You develop a bad headache that does not go away.  You develop fever, diarrhea, pain with urination, or shortness of breath.  You develop visual problems, blurry, or double vision.  You fall or are in a car accident or any kind of trauma.  There is mental or physical violence at home. Document Released: 11/10/2001 Document Revised: 02/08/2012 Document Reviewed: 05/15/2009 [facility] Patient Information 2013 [place], [place].

## 2013-04-06 ENCOUNTER — Telehealth: Payer: Self-pay | Admitting: *Deleted

## 2013-04-06 MED ORDER — ALBUTEROL SULFATE HFA 108 (90 BASE) MCG/ACT IN AERS: 2.0000 | INHALATION_SPRAY | Freq: Four times a day (QID) | RESPIRATORY_TRACT | Status: DC | PRN

## 2013-04-06 NOTE — Telephone Encounter (Signed)
Pt left message stating that her pharmacy did not receive Rx for inhaler yesterday. Per EPIC, Rx was printed not e-prescribed. I sent electronic Rx to pt's pharmacy and notified pt. She voiced understanding.

## 2013-04-11 ENCOUNTER — Other Ambulatory Visit: Payer: Medicaid Other

## 2013-04-19 ENCOUNTER — Encounter: Payer: Self-pay | Admitting: Obstetrics & Gynecology

## 2013-04-19 ENCOUNTER — Ambulatory Visit (INDEPENDENT_AMBULATORY_CARE_PROVIDER_SITE_OTHER): Payer: Medicaid Other | Admitting: Obstetrics & Gynecology

## 2013-04-19 VITALS — BP 128/87 | Wt 209.8 lb

## 2013-04-19 DIAGNOSIS — O0930 Supervision of pregnancy with insufficient antenatal care, unspecified trimester: Secondary | ICD-10-CM

## 2013-04-19 DIAGNOSIS — O0933 Supervision of pregnancy with insufficient antenatal care, third trimester: Secondary | ICD-10-CM

## 2013-04-19 DIAGNOSIS — Z3402 Encounter for supervision of normal first pregnancy, second trimester: Secondary | ICD-10-CM

## 2013-04-19 LAB — POCT URINALYSIS DIP (DEVICE)
Ketones, ur: NEGATIVE mg/dL
Protein, ur: NEGATIVE mg/dL
Specific Gravity, Urine: 1.025 (ref 1.005–1.030)
Urobilinogen, UA: 0.2 mg/dL (ref 0.0–1.0)
pH: 6.5 (ref 5.0–8.0)

## 2013-04-19 LAB — CBC
HCT: 32 % — ABNORMAL LOW (ref 36.0–46.0)
MCHC: 32.5 g/dL (ref 30.0–36.0)
MCV: 68.8 fL — ABNORMAL LOW (ref 78.0–100.0)
RDW: 17.3 % — ABNORMAL HIGH (ref 11.5–15.5)

## 2013-04-19 NOTE — Addendum Note (Signed)
Addended by: Franchot Mimes on: 04/19/2013 10:54 AM   Modules accepted: Orders

## 2013-04-19 NOTE — Progress Notes (Signed)
Routine visit.

## 2013-04-19 NOTE — Progress Notes (Signed)
Routine visit. Good FM. No OB problems. TOC for + CT. Glucola, labs, and TDAP today.

## 2013-04-19 NOTE — Progress Notes (Signed)
Pulse: 113 1hr gtt today due at 1055

## 2013-04-20 ENCOUNTER — Encounter: Payer: Self-pay | Admitting: Obstetrics & Gynecology

## 2013-04-20 LAB — GC/CHLAMYDIA PROBE AMP: GC Probe RNA: NEGATIVE

## 2013-05-10 ENCOUNTER — Ambulatory Visit (INDEPENDENT_AMBULATORY_CARE_PROVIDER_SITE_OTHER): Payer: Medicaid Other | Admitting: Advanced Practice Midwife

## 2013-05-10 VITALS — BP 146/74 | Temp 97.6°F | Wt 214.1 lb

## 2013-05-10 DIAGNOSIS — O0930 Supervision of pregnancy with insufficient antenatal care, unspecified trimester: Secondary | ICD-10-CM

## 2013-05-10 LAB — POCT URINALYSIS DIP (DEVICE)
Glucose, UA: NEGATIVE mg/dL
Ketones, ur: NEGATIVE mg/dL
Specific Gravity, Urine: 1.015 (ref 1.005–1.030)

## 2013-05-10 MED ORDER — ALBUTEROL SULFATE HFA 108 (90 BASE) MCG/ACT IN AERS: 2.0000 | INHALATION_SPRAY | Freq: Four times a day (QID) | RESPIRATORY_TRACT | Status: AC | PRN

## 2013-05-10 MED ORDER — PRENATAL MULTIVITAMIN CH: 1.0000 | ORAL_TABLET | Freq: Every day | ORAL | Status: DC

## 2013-05-10 NOTE — Progress Notes (Signed)
Pulse- 94  Edema- bilateral feet  Pain-"in right calf when I wake up"

## 2013-05-10 NOTE — Progress Notes (Signed)
Feeling well. Some heartburn and not sleeping well at night. Mostly uncomfortable and has to get up to void at night.  B/P recheck: 133/85

## 2013-05-22 ENCOUNTER — Ambulatory Visit (INDEPENDENT_AMBULATORY_CARE_PROVIDER_SITE_OTHER): Payer: Medicaid Other | Admitting: Family Medicine

## 2013-05-22 VITALS — BP 126/85 | Temp 99.8°F | Wt 214.0 lb

## 2013-05-22 DIAGNOSIS — O98319 Other infections with a predominantly sexual mode of transmission complicating pregnancy, unspecified trimester: Secondary | ICD-10-CM

## 2013-05-22 DIAGNOSIS — A749 Chlamydial infection, unspecified: Secondary | ICD-10-CM

## 2013-05-22 DIAGNOSIS — Z3403 Encounter for supervision of normal first pregnancy, third trimester: Secondary | ICD-10-CM

## 2013-05-22 DIAGNOSIS — R82998 Other abnormal findings in urine: Secondary | ICD-10-CM

## 2013-05-22 DIAGNOSIS — R8281 Pyuria: Secondary | ICD-10-CM

## 2013-05-22 LAB — POCT URINALYSIS DIP (DEVICE)
Bilirubin Urine: NEGATIVE
Glucose, UA: NEGATIVE mg/dL
Hgb urine dipstick: NEGATIVE
Ketones, ur: NEGATIVE mg/dL
Specific Gravity, Urine: 1.02 (ref 1.005–1.030)

## 2013-05-22 NOTE — Progress Notes (Signed)
Pulse 107  C/o LLQ pain in abdomen. Increased urinary frequency.

## 2013-05-22 NOTE — Progress Notes (Signed)
No complaints today. No HA or vision changes.

## 2013-05-22 NOTE — Patient Instructions (Addendum)

## 2013-05-24 LAB — CULTURE, OB URINE

## 2013-06-05 ENCOUNTER — Ambulatory Visit (INDEPENDENT_AMBULATORY_CARE_PROVIDER_SITE_OTHER): Payer: Medicaid Other | Admitting: Family Medicine

## 2013-06-05 VITALS — BP 118/75 | Temp 98.8°F | Wt 219.4 lb

## 2013-06-05 DIAGNOSIS — J45909 Unspecified asthma, uncomplicated: Secondary | ICD-10-CM

## 2013-06-05 DIAGNOSIS — Z3403 Encounter for supervision of normal first pregnancy, third trimester: Secondary | ICD-10-CM

## 2013-06-05 LAB — OB RESULTS CONSOLE GC/CHLAMYDIA
Chlamydia: POSITIVE
Gonorrhea: NEGATIVE

## 2013-06-05 LAB — POCT URINALYSIS DIP (DEVICE)
Bilirubin Urine: NEGATIVE
Glucose, UA: NEGATIVE mg/dL
Nitrite: NEGATIVE
pH: 7 (ref 5.0–8.0)

## 2013-06-05 LAB — OB RESULTS CONSOLE GBS: GBS: NEGATIVE

## 2013-06-05 NOTE — Patient Instructions (Addendum)
Vaginal Delivery  Your caregiver must first be sure you are in labor. Signs of labor include:   You may pass what is called "the mucus plug" before labor begins. This is a small amount of blood stained mucus.   Regular uterine contractions.   The time between contractions get closer together.   The discomfort and pain gradually gets more intense.   Pains are mostly located in the back.   Pains get worse when walking.   The cervix (the opening of the uterus) becomes thinner (begins to efface) and opens up (dilates).  Once you are in labor and admitted into the hospital or care center, your caregiver will do the following:   A complete physical examination.   Check your vital signs (blood pressure, pulse, temperature and the fetal heart rate).   Do a vaginal examination (using a sterile glove and lubricant) to determine:   The position (presentation) of the baby (head [vertex] or buttock first).   The level (station) of the baby's head in the birth canal.   The effacement and dilatation of the cervix.   You may have your pubic hair shaved and be given an enema depending on your caregiver and the circumstance.   An electronic monitor is usually placed on your abdomen. The monitor follows the length and intensity of the contractions, as well as the baby's heart rate.   Usually, your caregiver will insert an IV in your arm with a bottle of sugar water. This is done as a precaution so that medications can be given to you quickly during labor or delivery.  NORMAL LABOR AND DELIVERY IS DIVIDED UP INTO 3 STAGES:  First Stage  This is when regular contractions begin and the cervix begins to efface and dilate. This stage can last from 3 to 15 hours. The end of the first stage is when the cervix is 100% effaced and 10 centimeters dilated. Pain medications may be given by    Injection (morphine, demerol, etc.)    Regional anesthesia (spinal, caudal or epidural, anesthetics given in different locations of the spine). Paracervical pain medication may be given, which is an injection of and anesthetic on each side of the cervix.  A pregnant woman may request to have "Natural Childbirth" which is not to have any medications or anesthesia during her labor and delivery.  Second Stage  This is when the baby comes down through the birth canal (vagina) and is born. This can take 1 to 4 hours. As the baby's head comes down through the birth canal, you may feel like you are going to have a bowel movement. You will get the urge to bear down and push until the baby is delivered. As the baby's head is being delivered, the caregiver will decide if an episiotomy (a cut in the perineum and vagina area) is needed to prevent tearing of the tissue in this area. The episiotomy is sewn up after the delivery of the baby and placenta. Sometimes a mask with nitrous oxide is given for the mother to breath during the delivery of the baby to help if there is too much pain. The end of Stage 2 is when the baby is fully delivered. Then when the umbilical cord stops pulsating it is clamped and cut.  Third Stage  The third stage begins after the baby is completely delivered and ends after the placenta (afterbirth) is delivered. This usually takes 5 to 30 minutes. After the placenta is delivered, a medication is given   either by intravenous or injection to help contract the uterus and prevent bleeding. The third stage is not painful and pain medication is usually not necessary. If an episiotomy was done, it is repaired at this time.  After the delivery, the mother is watched and monitored closely for 1 to 2 hours to make sure there is no postpartum bleeding (hemorrhage). If there is a lot of bleeding, medication is given to contract the uterus and stop the bleeding.  Document Released: 08/25/2008 Document Revised: 08/10/2012 Document Reviewed: 08/25/2008   [employer] Patient Information 2014 [employer].

## 2013-06-05 NOTE — Progress Notes (Signed)
P=105, c/o pain under right armpit beside breast occasionally,

## 2013-06-06 ENCOUNTER — Inpatient Hospital Stay (HOSPITAL_COMMUNITY)
Admission: AD | Admit: 2013-06-06 | Discharge: 2013-06-06 | Disposition: A | Discharge status: Home / Self Care | Payer: Medicaid Other | Source: Ambulatory Visit | Attending: Obstetrics & Gynecology | Admitting: Obstetrics & Gynecology

## 2013-06-06 ENCOUNTER — Encounter (HOSPITAL_COMMUNITY): Payer: Self-pay | Admitting: *Deleted

## 2013-06-06 DIAGNOSIS — O4690 Antepartum hemorrhage, unspecified, unspecified trimester: Secondary | ICD-10-CM | POA: Insufficient documentation

## 2013-06-06 DIAGNOSIS — R109 Unspecified abdominal pain: Secondary | ICD-10-CM | POA: Insufficient documentation

## 2013-06-06 DIAGNOSIS — O4693 Antepartum hemorrhage, unspecified, third trimester: Secondary | ICD-10-CM

## 2013-06-06 LAB — URINE MICROSCOPIC-ADD ON

## 2013-06-06 LAB — URINALYSIS, ROUTINE W REFLEX MICROSCOPIC
Bilirubin Urine: NEGATIVE
Ketones, ur: NEGATIVE mg/dL
Nitrite: NEGATIVE
pH: 7 (ref 5.0–8.0)

## 2013-06-06 NOTE — MAU Note (Signed)
Patient states she had blood on the tissue after urinating. Patient is not wearing a pad and no bleeding at this time.  States she has an occasional cramp. Reports good fetal movement.

## 2013-06-06 NOTE — Progress Notes (Signed)
Pt states she felt cramps when she first noticed bleeding but she is not having them anymore

## 2013-06-06 NOTE — MAU Provider Note (Signed)
History     CSN: [id number]  Arrival date and time: 06/06/13 1443   None     Chief Complaint  Patient presents with  . Vaginal Bleeding   HPI 21yo G1P0000 at [redacted]w[redacted]d presents for vaginal bleeding. The bleeding occurred about 1 hour ago, she noticed red blood on the toilet paper after using the bathroom. The bleeding has not continued. She had some brief cramping at the time, but has not had any other pain or contractions. She has not had any gush of fluid. She still feels fetal movements. Yesterday she had a clinic appointment where they did a cervical check, and last night she had sexual intercourse.  No complaints of any other pain, headaches, vision changes.  Prenatal course: Normal anatomy US at 23wks Treated for Chlamydia 03/08/13  TOC negative 04/19/13.   OB History   Grav Para Term Preterm Abortions TAB SAB Ect Mult Living   1 0 0 0 0 0 0 0 0 0       Past Medical History  Diagnosis Date  . Asthma     History reviewed. No pertinent past surgical history.  Family History  Problem Relation Age of Onset  . Hypertension Mother   . Diabetes Maternal Grandmother     History  Substance Use Topics  . Smoking status: Never Smoker   . Smokeless tobacco: Never Used  . Alcohol Use: No    Allergies:  Allergies  Allergen Reactions  . Ibuprofen Swelling and Other (See Comments)    Facial swelling, itchy tongue    Prescriptions prior to admission  Medication Sig Dispense Refill  . albuterol (PROVENTIL HFA;VENTOLIN HFA) 108 (90 BASE) MCG/ACT inhaler Inhale 2 puffs into the lungs every 6 (six) hours as needed. For shortness of breath  6.7 g  0  . calcium carbonate (TUMS - DOSED IN MG ELEMENTAL CALCIUM) 500 MG chewable tablet Chew 2 tablets by mouth daily as needed for heartburn.      . Prenatal Vit-Fe Fumarate-FA (PRENATAL MULTIVITAMIN) TABS Take 1 tablet by mouth daily at 12 noon.  30 tablet  3    ROS negative except as above  Physical Exam   Blood pressure 120/76,  pulse 113, temperature 98.4 F (36.9 C), temperature source Oral, height 5\' 3"  (1.6 m), weight 102.604 kg (226 lb 3.2 oz).  Physical Exam General appearance: alert and no distress Head: Normocephalic, without obvious abnormality, atraumatic Lungs: clear to auscultation bilaterally Heart: regular rate and rhythm and S1, S2 normal Abdomen: gravid, non-tender to palpation, fundal height consistent with GA Extremities: no edema, redness or tenderness in the calves or thighs  Dilation: Fingertip Effacement (%): Thick Cervical Position: Posterior Station: -2 Exam by:: B.Bethea RN  Yesterday in clinic cervix was fingertip.  EFM: 150bpm, moderate variability, 15x15 accels present, no decels TOCO: no contractions  MAU Course  Procedures Results for orders placed during the hospital encounter of 06/06/13 (from the past 24 hour(s))  URINALYSIS, ROUTINE W REFLEX MICROSCOPIC     Status: Abnormal   Collection Time    06/06/13  4:15 PM      Result Value Range   Color, Urine YELLOW  YELLOW   APPearance CLEAR  CLEAR   Specific Gravity, Urine 1.010  1.005 - 1.030   pH 7.0  5.0 - 8.0   Glucose, UA NEGATIVE  NEGATIVE mg/dL   Hgb urine dipstick LARGE (*) NEGATIVE   Bilirubin Urine NEGATIVE  NEGATIVE   Ketones, ur NEGATIVE  NEGATIVE mg/dL   Protein, ur  NEGATIVE  NEGATIVE mg/dL   Urobilinogen, UA 0.2  0.0 - 1.0 mg/dL   Nitrite NEGATIVE  NEGATIVE   Leukocytes, UA MODERATE (*) NEGATIVE  URINE MICROSCOPIC-ADD ON     Status: None   Collection Time    06/06/13  4:15 PM      Result Value Range   Squamous Epithelial / LPF RARE  RARE   WBC, UA 3-6  <3 WBC/hpf   Bacteria, UA RARE  RARE    MDM   Assessment and Plan  21yo G1P0 at [redacted]w[redacted]d  # Vaginal bleeding - Yesterday had cervical exam, sexual intercourse - No continued bleeding, contractions - Fingertip cervix and no blood - FM reassuring  Stable for discharge, f/u with normal appointment on 06/12/13  Tawni Carnes 06/06/2013, 4:29 PM   Evaluation and management procedures were performed by Resident physician under my supervision/collaboration. Chart reviewed, patient examined by me and I agree with management and plan.

## 2013-06-06 NOTE — MAU Note (Addendum)
Pt states she noticed bleeding about 1 hour ago,Pt states she noticed bleeding when she wiped

## 2013-06-06 NOTE — Progress Notes (Signed)
GBS and cultures done today 

## 2013-06-07 LAB — GC/CHLAMYDIA PROBE AMP
CT Probe RNA: POSITIVE — AB
GC Probe RNA: NEGATIVE

## 2013-06-08 ENCOUNTER — Encounter: Payer: Self-pay | Admitting: Family Medicine

## 2013-06-12 ENCOUNTER — Ambulatory Visit (INDEPENDENT_AMBULATORY_CARE_PROVIDER_SITE_OTHER): Payer: Medicaid Other | Admitting: Obstetrics and Gynecology

## 2013-06-12 VITALS — BP 119/82 | Wt 218.9 lb

## 2013-06-12 DIAGNOSIS — O0933 Supervision of pregnancy with insufficient antenatal care, third trimester: Secondary | ICD-10-CM

## 2013-06-12 DIAGNOSIS — O98319 Other infections with a predominantly sexual mode of transmission complicating pregnancy, unspecified trimester: Secondary | ICD-10-CM

## 2013-06-12 DIAGNOSIS — A749 Chlamydial infection, unspecified: Secondary | ICD-10-CM

## 2013-06-12 DIAGNOSIS — O0930 Supervision of pregnancy with insufficient antenatal care, unspecified trimester: Secondary | ICD-10-CM

## 2013-06-12 LAB — POCT URINALYSIS DIP (DEVICE)
Glucose, UA: NEGATIVE mg/dL
Nitrite: NEGATIVE
Specific Gravity, Urine: 1.025 (ref 1.005–1.030)
Urobilinogen, UA: 0.2 mg/dL (ref 0.0–1.0)

## 2013-06-12 MED ORDER — AZITHROMYCIN 1 G PO PACK: 1.0000 | PACK | Freq: Once | ORAL | Status: DC

## 2013-06-12 NOTE — Progress Notes (Signed)
Pulse: 103 Chlamydia is positive, pt needs treatment.

## 2013-06-12 NOTE — Patient Instructions (Signed)

## 2013-06-12 NOTE — Progress Notes (Signed)
Rx zithro and get partner tx. S/sx labor reviewed

## 2013-06-19 ENCOUNTER — Ambulatory Visit (INDEPENDENT_AMBULATORY_CARE_PROVIDER_SITE_OTHER): Payer: Medicaid Other | Admitting: Obstetrics and Gynecology

## 2013-06-19 VITALS — BP 131/87 | Temp 97.7°F | Wt 217.0 lb

## 2013-06-19 DIAGNOSIS — O0930 Supervision of pregnancy with insufficient antenatal care, unspecified trimester: Secondary | ICD-10-CM

## 2013-06-19 LAB — POCT URINALYSIS DIP (DEVICE)
Bilirubin Urine: NEGATIVE
Glucose, UA: 100 mg/dL — AB
Nitrite: NEGATIVE

## 2013-06-19 LAB — GLUCOSE, CAPILLARY: Glucose-Capillary: 108 mg/dL — ABNORMAL HIGH (ref 70–99)

## 2013-06-19 NOTE — Progress Notes (Signed)
Pulse:109 

## 2013-06-19 NOTE — Progress Notes (Signed)
Doing well. Info nexplanon and s/sx labor reviewed. fs glucose for glucosuria.

## 2013-06-19 NOTE — Patient Instructions (Signed)

## 2013-06-26 ENCOUNTER — Ambulatory Visit (INDEPENDENT_AMBULATORY_CARE_PROVIDER_SITE_OTHER): Payer: Medicaid Other | Admitting: Obstetrics & Gynecology

## 2013-06-26 VITALS — BP 123/81 | Temp 99.5°F | Wt 221.6 lb

## 2013-06-26 DIAGNOSIS — Z3403 Encounter for supervision of normal first pregnancy, third trimester: Secondary | ICD-10-CM

## 2013-06-26 DIAGNOSIS — O0930 Supervision of pregnancy with insufficient antenatal care, unspecified trimester: Secondary | ICD-10-CM

## 2013-06-26 LAB — POCT URINALYSIS DIP (DEVICE)
Bilirubin Urine: NEGATIVE
Glucose, UA: NEGATIVE mg/dL
Nitrite: NEGATIVE
Urobilinogen, UA: 1 mg/dL (ref 0.0–1.0)

## 2013-06-26 NOTE — Progress Notes (Signed)
Pulse- 101 Patient reports pain/pressure in groin

## 2013-06-26 NOTE — Patient Instructions (Signed)
Vaginal Delivery  Your caregiver must first be sure you are in labor. Signs of labor include:   You may pass what is called "the mucus plug" before labor begins. This is a small amount of blood stained mucus.   Regular uterine contractions.   The time between contractions get closer together.   The discomfort and pain gradually gets more intense.   Pains are mostly located in the back.   Pains get worse when walking.   The cervix (the opening of the uterus) becomes thinner (begins to efface) and opens up (dilates).  Once you are in labor and admitted into the hospital or care center, your caregiver will do the following:   A complete physical examination.   Check your vital signs (blood pressure, pulse, temperature and the fetal heart rate).   Do a vaginal examination (using a sterile glove and lubricant) to determine:   The position (presentation) of the baby (head [vertex] or buttock first).   The level (station) of the baby's head in the birth canal.   The effacement and dilatation of the cervix.   You may have your pubic hair shaved and be given an enema depending on your caregiver and the circumstance.   An electronic monitor is usually placed on your abdomen. The monitor follows the length and intensity of the contractions, as well as the baby's heart rate.   Usually, your caregiver will insert an IV in your arm with a bottle of sugar water. This is done as a precaution so that medications can be given to you quickly during labor or delivery.  NORMAL LABOR AND DELIVERY IS DIVIDED UP INTO 3 STAGES:  First Stage  This is when regular contractions begin and the cervix begins to efface and dilate. This stage can last from 3 to 15 hours. The end of the first stage is when the cervix is 100% effaced and 10 centimeters dilated. Pain medications may be given by    Injection (morphine, demerol, etc.)    Regional anesthesia (spinal, caudal or epidural, anesthetics given in different locations of the spine). Paracervical pain medication may be given, which is an injection of and anesthetic on each side of the cervix.  A pregnant woman may request to have "Natural Childbirth" which is not to have any medications or anesthesia during her labor and delivery.  Second Stage  This is when the baby comes down through the birth canal (vagina) and is born. This can take 1 to 4 hours. As the baby's head comes down through the birth canal, you may feel like you are going to have a bowel movement. You will get the urge to bear down and push until the baby is delivered. As the baby's head is being delivered, the caregiver will decide if an episiotomy (a cut in the perineum and vagina area) is needed to prevent tearing of the tissue in this area. The episiotomy is sewn up after the delivery of the baby and placenta. Sometimes a mask with nitrous oxide is given for the mother to breath during the delivery of the baby to help if there is too much pain. The end of Stage 2 is when the baby is fully delivered. Then when the umbilical cord stops pulsating it is clamped and cut.  Third Stage  The third stage begins after the baby is completely delivered and ends after the placenta (afterbirth) is delivered. This usually takes 5 to 30 minutes. After the placenta is delivered, a medication is given   either by intravenous or injection to help contract the uterus and prevent bleeding. The third stage is not painful and pain medication is usually not necessary. If an episiotomy was done, it is repaired at this time.  After the delivery, the mother is watched and monitored closely for 1 to 2 hours to make sure there is no postpartum bleeding (hemorrhage). If there is a lot of bleeding, medication is given to contract the uterus and stop the bleeding.  Document Released: 08/25/2008 Document Revised: 08/10/2012 Document Reviewed: 08/25/2008   [employer] Patient Information 2014 [employer].

## 2013-06-26 NOTE — Progress Notes (Signed)
Occasional contractions, no ROM. Precautions given

## 2013-06-28 ENCOUNTER — Inpatient Hospital Stay (HOSPITAL_COMMUNITY): Payer: Medicaid Other | Admitting: Anesthesiology

## 2013-06-28 ENCOUNTER — Inpatient Hospital Stay (HOSPITAL_COMMUNITY)
Admission: AD | Admit: 2013-06-28 | Discharge: 2013-06-30 | DRG: 775 | Disposition: A | Discharge status: Home / Self Care | Payer: Medicaid Other | Source: Ambulatory Visit | Attending: Obstetrics and Gynecology | Admitting: Obstetrics and Gynecology

## 2013-06-28 ENCOUNTER — Encounter (HOSPITAL_COMMUNITY): Payer: Self-pay | Admitting: Anesthesiology

## 2013-06-28 ENCOUNTER — Encounter (HOSPITAL_COMMUNITY): Payer: Self-pay | Admitting: *Deleted

## 2013-06-28 DIAGNOSIS — Z3403 Encounter for supervision of normal first pregnancy, third trimester: Secondary | ICD-10-CM

## 2013-06-28 DIAGNOSIS — O99519 Diseases of the respiratory system complicating pregnancy, unspecified trimester: Secondary | ICD-10-CM

## 2013-06-28 DIAGNOSIS — IMO0001 Reserved for inherently not codable concepts without codable children: Secondary | ICD-10-CM

## 2013-06-28 DIAGNOSIS — A749 Chlamydial infection, unspecified: Secondary | ICD-10-CM

## 2013-06-28 DIAGNOSIS — O0933 Supervision of pregnancy with insufficient antenatal care, third trimester: Secondary | ICD-10-CM

## 2013-06-28 LAB — ABO/RH: ABO/RH(D): A POS

## 2013-06-28 LAB — TYPE AND SCREEN
ABO/RH(D): A POS
Antibody Screen: NEGATIVE

## 2013-06-28 LAB — CBC
Hemoglobin: 12.1 g/dL (ref 12.0–15.0)
Platelets: 223 10*3/uL (ref 150–400)
RBC: 5.16 MIL/uL — ABNORMAL HIGH (ref 3.87–5.11)
WBC: 13 10*3/uL — ABNORMAL HIGH (ref 4.0–10.5)

## 2013-06-28 LAB — RPR: RPR Ser Ql: NONREACTIVE

## 2013-06-28 MED ORDER — FENTANYL 2.5 MCG/ML BUPIVACAINE 1/10 % EPIDURAL INFUSION (WH - ANES)
INTRAMUSCULAR | Status: DC | PRN
  Administered 2013-06-28: 14 mL/h via EPIDURAL

## 2013-06-28 MED ORDER — FENTANYL 2.5 MCG/ML BUPIVACAINE 1/10 % EPIDURAL INFUSION (WH - ANES)
14.0000 mL/h | INTRAMUSCULAR | Status: DC | PRN
  Filled 2013-06-28: qty 125

## 2013-06-28 MED ORDER — LACTATED RINGERS IV SOLN
INTRAVENOUS | Status: DC
  Administered 2013-06-28 (×2): via INTRAVENOUS

## 2013-06-28 MED ORDER — BISACODYL 10 MG RE SUPP: 10.0000 mg | Freq: Every day | RECTAL | Status: DC | PRN

## 2013-06-28 MED ORDER — ZOLPIDEM TARTRATE 5 MG PO TABS: 5.0000 mg | ORAL_TABLET | Freq: Every evening | ORAL | Status: DC | PRN

## 2013-06-28 MED ORDER — ALBUTEROL SULFATE HFA 108 (90 BASE) MCG/ACT IN AERS
2.0000 | INHALATION_SPRAY | RESPIRATORY_TRACT | Status: DC | PRN
  Administered 2013-06-28: 2 via RESPIRATORY_TRACT
  Filled 2013-06-28: qty 6.7

## 2013-06-28 MED ORDER — ONDANSETRON HCL 4 MG/2ML IJ SOLN: 4.0000 mg | Freq: Four times a day (QID) | INTRAMUSCULAR | Status: DC | PRN

## 2013-06-28 MED ORDER — LANOLIN HYDROUS EX OINT: TOPICAL_OINTMENT | CUTANEOUS | Status: DC | PRN

## 2013-06-28 MED ORDER — WITCH HAZEL-GLYCERIN EX PADS: 1.0000 "application " | MEDICATED_PAD | CUTANEOUS | Status: DC | PRN

## 2013-06-28 MED ORDER — OXYCODONE-ACETAMINOPHEN 5-325 MG PO TABS: 1.0000 | ORAL_TABLET | ORAL | Status: DC | PRN

## 2013-06-28 MED ORDER — ONDANSETRON HCL 4 MG PO TABS: 4.0000 mg | ORAL_TABLET | ORAL | Status: DC | PRN

## 2013-06-28 MED ORDER — OXYTOCIN 40 UNITS IN LACTATED RINGERS INFUSION - SIMPLE MED
62.5000 mL/h | INTRAVENOUS | Status: DC
  Filled 2013-06-28: qty 1000

## 2013-06-28 MED ORDER — SIMETHICONE 80 MG PO CHEW: 80.0000 mg | CHEWABLE_TABLET | ORAL | Status: DC | PRN

## 2013-06-28 MED ORDER — EPHEDRINE 5 MG/ML INJ
10.0000 mg | INTRAVENOUS | Status: DC | PRN
  Filled 2013-06-28: qty 2
  Filled 2013-06-28: qty 4

## 2013-06-28 MED ORDER — OXYCODONE-ACETAMINOPHEN 5-325 MG PO TABS
1.0000 | ORAL_TABLET | ORAL | Status: DC | PRN
  Administered 2013-06-29 – 2013-06-30 (×5): 1 via ORAL
  Filled 2013-06-28: qty 1
  Filled 2013-06-28: qty 2
  Filled 2013-06-28 (×2): qty 1

## 2013-06-28 MED ORDER — NALBUPHINE SYRINGE 5 MG/0.5 ML
10.0000 mg | INJECTION | INTRAMUSCULAR | Status: DC | PRN
  Administered 2013-06-28: 10 mg via INTRAMUSCULAR
  Filled 2013-06-28: qty 1

## 2013-06-28 MED ORDER — DIPHENHYDRAMINE HCL 50 MG/ML IJ SOLN: 12.5000 mg | INTRAMUSCULAR | Status: DC | PRN

## 2013-06-28 MED ORDER — EPHEDRINE 5 MG/ML INJ
10.0000 mg | INTRAVENOUS | Status: DC | PRN
  Filled 2013-06-28: qty 2

## 2013-06-28 MED ORDER — LIDOCAINE HCL (PF) 1 % IJ SOLN
INTRAMUSCULAR | Status: DC | PRN
  Administered 2013-06-28 (×2): 4 mL

## 2013-06-28 MED ORDER — PHENYLEPHRINE 40 MCG/ML (10ML) SYRINGE FOR IV PUSH (FOR BLOOD PRESSURE SUPPORT)
80.0000 ug | PREFILLED_SYRINGE | INTRAVENOUS | Status: DC | PRN
  Filled 2013-06-28: qty 2
  Filled 2013-06-28: qty 5

## 2013-06-28 MED ORDER — ONDANSETRON HCL 4 MG/2ML IJ SOLN: 4.0000 mg | INTRAMUSCULAR | Status: DC | PRN

## 2013-06-28 MED ORDER — DIPHENHYDRAMINE HCL 25 MG PO CAPS: 25.0000 mg | ORAL_CAPSULE | Freq: Four times a day (QID) | ORAL | Status: DC | PRN

## 2013-06-28 MED ORDER — MISOPROSTOL 200 MCG PO TABS
ORAL_TABLET | ORAL | Status: AC
  Filled 2013-06-28: qty 5

## 2013-06-28 MED ORDER — MEASLES, MUMPS & RUBELLA VAC ~~LOC~~ INJ
0.5000 mL | INJECTION | Freq: Once | SUBCUTANEOUS | Status: DC
  Filled 2013-06-28: qty 0.5

## 2013-06-28 MED ORDER — ACETAMINOPHEN 325 MG PO TABS: 650.0000 mg | ORAL_TABLET | ORAL | Status: DC | PRN

## 2013-06-28 MED ORDER — LIDOCAINE HCL (PF) 1 % IJ SOLN
30.0000 mL | INTRAMUSCULAR | Status: DC | PRN
  Filled 2013-06-28 (×2): qty 30

## 2013-06-28 MED ORDER — OXYTOCIN 40 UNITS IN LACTATED RINGERS INFUSION - SIMPLE MED: 62.5000 mL/h | INTRAVENOUS | Status: DC | PRN

## 2013-06-28 MED ORDER — SENNOSIDES-DOCUSATE SODIUM 8.6-50 MG PO TABS
2.0000 | ORAL_TABLET | Freq: Every day | ORAL | Status: DC
  Administered 2013-06-29 (×2): 2 via ORAL

## 2013-06-28 MED ORDER — FERROUS SULFATE 325 (65 FE) MG PO TABS
325.0000 mg | ORAL_TABLET | Freq: Two times a day (BID) | ORAL | Status: DC
  Administered 2013-06-29 – 2013-06-30 (×3): 325 mg via ORAL
  Filled 2013-06-28 (×3): qty 1

## 2013-06-28 MED ORDER — LACTATED RINGERS IV SOLN: 500.0000 mL | INTRAVENOUS | Status: DC | PRN

## 2013-06-28 MED ORDER — LACTATED RINGERS IV SOLN: 500.0000 mL | Freq: Once | INTRAVENOUS | Status: DC

## 2013-06-28 MED ORDER — PRENATAL MULTIVITAMIN CH
1.0000 | ORAL_TABLET | Freq: Every day | ORAL | Status: DC
  Administered 2013-06-29: 1 via ORAL
  Filled 2013-06-28: qty 1

## 2013-06-28 MED ORDER — CITRIC ACID-SODIUM CITRATE 334-500 MG/5ML PO SOLN: 30.0000 mL | ORAL | Status: DC | PRN

## 2013-06-28 MED ORDER — PHENYLEPHRINE 40 MCG/ML (10ML) SYRINGE FOR IV PUSH (FOR BLOOD PRESSURE SUPPORT)
80.0000 ug | PREFILLED_SYRINGE | INTRAVENOUS | Status: DC | PRN
  Filled 2013-06-28: qty 2

## 2013-06-28 MED ORDER — OXYTOCIN BOLUS FROM INFUSION: 500.0000 mL | INTRAVENOUS | Status: DC

## 2013-06-28 MED ORDER — MISOPROSTOL 200 MCG PO TABS
1000.0000 ug | ORAL_TABLET | Freq: Once | ORAL | Status: AC
  Administered 2013-06-28: 1000 ug via VAGINAL

## 2013-06-28 MED ORDER — BENZOCAINE-MENTHOL 20-0.5 % EX AERO
1.0000 "application " | INHALATION_SPRAY | CUTANEOUS | Status: DC | PRN
  Administered 2013-06-28: 1 via TOPICAL
  Filled 2013-06-28 (×2): qty 56

## 2013-06-28 MED ORDER — DIBUCAINE 1 % RE OINT: 1.0000 "application " | TOPICAL_OINTMENT | RECTAL | Status: DC | PRN

## 2013-06-28 MED ORDER — FLEET ENEMA 7-19 GM/118ML RE ENEM: 1.0000 | ENEMA | Freq: Every day | RECTAL | Status: DC | PRN

## 2013-06-28 MED ORDER — NALBUPHINE SYRINGE 5 MG/0.5 ML
10.0000 mg | INJECTION | INTRAMUSCULAR | Status: DC | PRN
  Filled 2013-06-28: qty 1

## 2013-06-28 MED ORDER — TETANUS-DIPHTH-ACELL PERTUSSIS 5-2.5-18.5 LF-MCG/0.5 IM SUSP: 0.5000 mL | Freq: Once | INTRAMUSCULAR | Status: DC

## 2013-06-28 NOTE — Anesthesia Preprocedure Evaluation (Signed)
Anesthesia Evaluation  Patient identified by MRN, date of birth, ID band Patient awake    Reviewed: Allergy & Precautions, H&P , Patient's Chart, lab work & pertinent test results  Airway Mallampati: III TM Distance: >3 FB Neck ROM: Full    Dental no notable dental hx. (+) Teeth Intact   Pulmonary asthma ,  breath sounds clear to auscultation  Pulmonary exam normal       Cardiovascular negative cardio ROS  Rhythm:Regular Rate:Normal     Neuro/Psych negative neurological ROS  negative psych ROS   GI/Hepatic Neg liver ROS, GERD-  Medicated and Controlled,  Endo/Other  Morbid obesity  Renal/GU negative Renal ROS  negative genitourinary   Musculoskeletal   Abdominal (+) + obese,   Peds  Hematology  (+) Blood dyscrasia, anemia , Hb C   Anesthesia Other Findings   Reproductive/Obstetrics (+) Pregnancy                           Anesthesia Physical Anesthesia Plan  ASA: III  Anesthesia Plan: Epidural   Post-op Pain Management:    Induction:   Airway Management Planned: Natural Airway  Additional Equipment:   Intra-op Plan:   Post-operative Plan:   Informed Consent: I have reviewed the patients History and Physical, chart, labs and discussed the procedure including the risks, benefits and alternatives for the proposed anesthesia with the patient or authorized representative who has indicated his/her understanding and acceptance.   Dental advisory given  Plan Discussed with: Anesthesiologist and CRNA  Anesthesia Plan Comments:         Anesthesia Quick Evaluation

## 2013-06-28 NOTE — H&P (Signed)
Tracy Stone is a 22 y.o. female presenting for contractions starting at 5:30 am today and getting stronger and closer together. Contracting every 2 minutes. No loss of fluid or bleeding. Baby moving well.  Pt receives care at [facility] risk clinic. No complications of this first pregnancy. 1 hour GTT 96, normal ultrasounds, declined genetic screening. One elevated BP of 140/76 at 32 weeks. GBS negative. Plans to breastfeed and nexplannon for contraception.  Maternal Medical History:  Reason for admission: Contractions.   Contractions: Onset was 6-12 hours ago.   Frequency: regular.   Perceived severity is strong.    Fetal activity: Perceived fetal activity is normal.   Last perceived fetal movement was within the past hour.    Prenatal complications: no prenatal complications Prenatal Complications - Diabetes: none.    OB History   Grav Para Term Preterm Abortions TAB SAB Ect Mult Living   1 0 0 0 0 0 0 0 0 0      Past Medical History  Diagnosis Date  . Asthma    History reviewed. No pertinent past surgical history. Family History: family history includes Diabetes in her maternal grandmother and Hypertension in her mother. Social History:  reports that she has never smoked. She has never used smokeless tobacco. She reports that she does not drink alcohol or use illicit drugs.   Prenatal Transfer Tool  Maternal Diabetes: No Genetic Screening: Declined Maternal Ultrasounds/Referrals: Normal Fetal Ultrasounds or other Referrals:  None Maternal Substance Abuse:  No Significant Maternal Medications:  None Significant Maternal Lab Results:  Lab values include: Group B Strep negative Other Comments:  None  ROS  See HPI  Dilation: 6 Effacement (%): 90 Station: -2 Exam by:: Raliegh Ip RN Blood pressure 123/69, pulse 95, temperature 98.7 F (37.1 C), temperature source Oral, resp. rate 18, height 5\' 3"  (1.6 m), weight 100.245 kg (221 lb), SpO2 98.00%.  Exam Physical Exam   GEN:  WNWD, no distress HEENT:  NCAT, EOMI, conjunctiva clear NECK:  Supple, non-tender, no thyromegaly, trachea midline CV: RRR, no murmur RESP:  CTAB ABD:  Soft, non-tender, no guarding or rebound, normal bowel sounds EXTREM:  Warm, well perfused, no edema or tenderness NEURO:  Alert, oriented, no focal deficits  Cervix 4/80/-2 Changed from 2/50/-2 initially    Prenatal labs: ABO, Rh: --/--/A POS (07/30 1213) Antibody: NEG (07/30 1213) Rubella: 2.37 (04/09 1131) RPR: NON REAC (05/21 1112)  HBsAg: NEGATIVE (04/09 1131)  HIV: NON REACTIVE (05/21 1112)  GBS: Negative (07/07 0000)   Assessment/Plan: 22 y.o. G1P0000 at [redacted]w[redacted]d with SOL. - Admit to L&D - Epidural when desired - GBS negative, no PCN - Anticipate SVD  Napoleon Form 06/28/2013, 4:23 PM

## 2013-06-28 NOTE — MAU Note (Signed)
Contractions started at 0515, no bleeding or leaking. Was .5cm last wk

## 2013-06-28 NOTE — Anesthesia Procedure Notes (Signed)
Epidural Patient location during procedure: OB Start time: 06/28/2013 1:38 PM  Staffing Anesthesiologist: Teara Duerksen A. Performed by: anesthesiologist   Preanesthetic Checklist Completed: patient identified, site marked, surgical consent, pre-op evaluation, timeout performed, IV checked, risks and benefits discussed and monitors and equipment checked  Epidural Patient position: sitting Prep: site prepped and draped and DuraPrep Patient monitoring: continuous pulse ox and blood pressure Approach: midline Injection technique: LOR air  Needle:  Needle type: Tuohy  Needle gauge: 17 G Needle length: 9 cm and 9 Needle insertion depth: 6 cm Catheter type: closed end flexible Catheter size: 19 Gauge Catheter at skin depth: 11 cm Test dose: negative and Other  Assessment Events: blood not aspirated, injection not painful, no injection resistance, negative IV test and no paresthesia  Additional Notes Patient identified. Risks and benefits discussed including failed block, incomplete  Pain control, post dural puncture headache, nerve damage, paralysis, blood pressure Changes, nausea, vomiting, reactions to medications-both toxic and allergic and post Partum back pain. All questions were answered. Patient expressed understanding and wished to proceed. Sterile technique was used throughout procedure. Epidural site was Dressed with sterile barrier dressing. No paresthesias, signs of intravascular injection Or signs of intrathecal spread were encountered.  Patient was more comfortable after the epidural was dosed. Please see RN's note for documentation of vital signs and FHR which are stable.

## 2013-06-29 LAB — GC/CHLAMYDIA PROBE AMP: GC Probe RNA: NEGATIVE

## 2013-06-29 MED ORDER — PNEUMOCOCCAL VAC POLYVALENT 25 MCG/0.5ML IJ INJ
0.5000 mL | INJECTION | INTRAMUSCULAR | Status: AC
  Administered 2013-06-30: 0.5 mL via INTRAMUSCULAR
  Filled 2013-06-29: qty 0.5

## 2013-06-29 MED ORDER — ACETAMINOPHEN 325 MG PO TABS
650.0000 mg | ORAL_TABLET | Freq: Four times a day (QID) | ORAL | Status: DC | PRN
  Administered 2013-06-29 (×2): 650 mg via ORAL
  Filled 2013-06-29 (×3): qty 2

## 2013-06-29 NOTE — Anesthesia Postprocedure Evaluation (Signed)
Anesthesia Post Note  Patient: Tracy Stone  Procedure(s) Performed: * No procedures listed *  Anesthesia type: Epidural  Patient location: Mother/Baby  Post pain: Pain level controlled  Post assessment: Post-op Vital signs reviewed  Last Vitals:  Filed Vitals:   06/29/13 0430  BP: 128/81  Pulse: 88  Temp: 36.7 C  Resp: 18    Post vital signs: Reviewed  Level of consciousness: awake  Complications: No apparent anesthesia complications Anesthesia Post-op Note  Patient: Tracy Stone  Procedure(s) Performed: * No procedures listed *  Patient Location: PACU and Mother/Baby  Anesthesia Type:Epidural  Level of Consciousness: awake, alert  and oriented  Airway and Oxygen Therapy: Patient Spontanous Breathing  Post-op Pain: none  Post-op Assessment: Post-op Vital signs reviewed, No headache, No backache, No residual numbness and No residual motor weakness  Post-op Vital Signs: Reviewed and stable  Complications: No apparent anesthesia complications

## 2013-06-29 NOTE — Progress Notes (Signed)
Post Partum Day 1 Subjective: up ad lib, voiding, tolerating PO and + flatus. Pain in lower back on spine where epidural was placed. Bleeding less than a period.  Objective: Blood pressure 128/81, pulse 88, temperature 98.1 F (36.7 C), temperature source Oral, resp. rate 18, height 5\' 3"  (1.6 m), weight 100.245 kg (221 lb), SpO2 98.00%, unknown if currently breastfeeding.  Physical Exam:  General: alert, cooperative and no distress Lochia: appropriate Uterine Fundus: firm DVT Evaluation: No evidence of DVT seen on physical exam. No cords or calf tenderness. No significant calf/ankle edema.   Recent Labs  06/28/13 1213  HGB 12.1  HCT 35.2*    Assessment/Plan: Plan for discharge tomorrow, Breastfeeding and Contraception nexplanon Can't take motrin, switched to tylenol.   LOS: 1 day   Tracy Stone 06/29/2013, 7:54 AM

## 2013-06-29 NOTE — Progress Notes (Signed)
I spoke with and examined patient and agree with PA-S's note and plan of care.  Tawana Scale, MD Ob Fellow 06/29/2013 9:07 AM

## 2013-06-29 NOTE — Progress Notes (Signed)
UR chart review completed.  

## 2013-06-30 MED ORDER — ACETAMINOPHEN 325 MG PO TABS: 650.0000 mg | ORAL_TABLET | Freq: Four times a day (QID) | ORAL | Status: DC | PRN

## 2013-06-30 MED ORDER — HYDROCODONE-ACETAMINOPHEN 5-325 MG PO TABS: 1.0000 | ORAL_TABLET | Freq: Four times a day (QID) | ORAL | Status: DC | PRN

## 2013-06-30 MED ORDER — DOCUSATE SODIUM 100 MG PO CAPS: 100.0000 mg | ORAL_CAPSULE | Freq: Two times a day (BID) | ORAL | Status: DC

## 2013-06-30 NOTE — Discharge Summary (Signed)
I agree with resident note, I saw patient who is ready for discharge.  Adam Phenix, MD

## 2013-06-30 NOTE — Discharge Summary (Signed)
Obstetric Discharge Summary Reason for Admission: onset of labor Prenatal Procedures: none Intrapartum Procedures: spontaneous vaginal delivery Postpartum Procedures: none Complications-Operative and Postpartum: none  Breastfeeding, nexplanon for birth control  Hemoglobin  Date Value Range Status  06/28/2013 12.1  12.0 - 15.0 g/dL Final     HCT  Date Value Range Status  06/28/2013 35.2* 36.0 - 46.0 % Final    Physical Exam:  General: alert, cooperative and no distress Lochia: appropriate, light Uterine Fundus: firm DVT Evaluation: No evidence of DVT seen on physical exam. No cords or calf tenderness. No significant calf/ankle edema.  Discharge Diagnoses: Term Pregnancy-delivered  Discharge Information: Date: 06/30/2013 Activity: pelvic rest Diet: routine Medications: PNV and tylenol Condition: stable Instructions: refer to practice specific booklet Discharge to: home Follow-up Information   Follow up with [facility] Schedule an appointment as soon as possible for a visit in 4 weeks.   Contact information:   [address] [place] [address] [phone]      Newborn Data: Live born female  Birth Weight: 6 lb 3.7 oz (2825 g) APGAR: 8, 9  Home with mother.  Tawni Carnes 06/30/2013, 7:40 AM

## 2013-07-02 ENCOUNTER — Inpatient Hospital Stay (HOSPITAL_COMMUNITY)
Admission: AD | Admit: 2013-07-02 | Discharge: 2013-07-02 | Disposition: A | Discharge status: Home / Self Care | Payer: Medicaid Other | Source: Ambulatory Visit | Attending: Family Medicine | Admitting: Family Medicine

## 2013-07-02 ENCOUNTER — Encounter (HOSPITAL_COMMUNITY): Payer: Self-pay | Admitting: *Deleted

## 2013-07-02 DIAGNOSIS — O8612 Endometritis following delivery: Secondary | ICD-10-CM | POA: Insufficient documentation

## 2013-07-02 LAB — CBC
Hemoglobin: 9.4 g/dL — ABNORMAL LOW (ref 12.0–15.0)
MCH: 23.1 pg — ABNORMAL LOW (ref 26.0–34.0)
Platelets: 237 10*3/uL (ref 150–400)
RBC: 4.07 MIL/uL (ref 3.87–5.11)
WBC: 16.7 10*3/uL — ABNORMAL HIGH (ref 4.0–10.5)

## 2013-07-02 MED ORDER — CEFTRIAXONE SODIUM 1 G IJ SOLR: 1.0000 g | Freq: Once | INTRAMUSCULAR | Status: DC

## 2013-07-02 MED ORDER — AZITHROMYCIN 250 MG PO TABS
1000.0000 mg | ORAL_TABLET | Freq: Once | ORAL | Status: AC
  Administered 2013-07-02: 1000 mg via ORAL
  Filled 2013-07-02: qty 4

## 2013-07-02 MED ORDER — AMOXICILLIN-POT CLAVULANATE 875-125 MG PO TABS: 1.0000 | ORAL_TABLET | Freq: Two times a day (BID) | ORAL | Status: DC

## 2013-07-02 MED ORDER — CEFTRIAXONE SODIUM 1 G IJ SOLR
1.0000 g | Freq: Once | INTRAMUSCULAR | Status: AC
  Administered 2013-07-02: 1 g via INTRAMUSCULAR
  Filled 2013-07-02: qty 10

## 2013-07-02 NOTE — MAU Note (Signed)
Pt states she has increaded vaginal bleeding and is passing blood clots-started around 1800 today-states she had a vaginal del on 06-28-2013-intact perineum-states has been breast feeding and stopped today because her breasts are "too big and heavy"

## 2013-07-02 NOTE — MAU Provider Note (Signed)
History     CSN: [id number]  Arrival date and time: 07/02/13 2051   First Provider Initiated Contact with Patient 07/02/13 2120      Chief Complaint  Patient presents with  . Vaginal Bleeding   HPI Ms. Tracy Stone is a 22 y.o. G1P1001 who is 4 days PP after SVD without complications. She presents to MAU today with complaint of increased vaginal bleeding and odorous discharge. She denies any complications with the pregnancy or delivery. She states an increase in bleeding around 1800 tonight. She has occasional cramps rated at 6-7/10 at the worst, but denies pain now. She states that she stopped breastfeeding today and is having fullness and tenderness of the breasts. She denies rash, dizziness, fatigue or fever. She states occasional headache today as well as some weakness.   OB History   Grav Para Term Preterm Abortions TAB SAB Ect Mult Living   1 1 1  0 0 0 0 0 0 1      Past Medical History  Diagnosis Date  . Asthma     Past Surgical History  Procedure Laterality Date  . No past surgeries      Family History  Problem Relation Age of Onset  . Hypertension Mother   . Diabetes Maternal Grandmother     History  Substance Use Topics  . Smoking status: Never Smoker   . Smokeless tobacco: Never Used  . Alcohol Use: No    Allergies:  Allergies  Allergen Reactions  . Ibuprofen Swelling and Other (See Comments)    Facial swelling, itchy tongue    Prescriptions prior to admission  Medication Sig Dispense Refill  . acetaminophen (TYLENOL) 325 MG tablet Take 2 tablets (650 mg total) by mouth every 6 (six) hours as needed.  60 tablet  0  . albuterol (PROVENTIL HFA;VENTOLIN HFA) 108 (90 BASE) MCG/ACT inhaler Inhale 2 puffs into the lungs every 6 (six) hours as needed. For shortness of breath  6.7 g  0  . calcium carbonate (TUMS - DOSED IN MG ELEMENTAL CALCIUM) 500 MG chewable tablet Chew 2 tablets by mouth 2 (two) times daily as needed for heartburn.       . docusate  sodium (COLACE) 100 MG capsule Take 1 capsule (100 mg total) by mouth 2 (two) times daily.  20 capsule  0  . HYDROcodone-acetaminophen (NORCO) 5-325 MG per tablet Take 1 tablet by mouth every 6 (six) hours as needed for pain.  20 tablet  0  . Prenatal Vit-Fe Fumarate-FA (PRENATAL MULTIVITAMIN) TABS Take 1 tablet by mouth daily at 12 noon.  30 tablet  3    Review of Systems  Constitutional: Negative for fever and malaise/fatigue.  Gastrointestinal: Positive for abdominal pain.  Genitourinary: Negative for dysuria, urgency and frequency.       + vaginal bleeding   Physical Exam   Blood pressure 139/78, pulse 73, resp. rate 16, height 5\' 3"  (1.6 m), weight 219 lb (99.338 kg), SpO2 99.00%, not currently breastfeeding.  Physical Exam  Constitutional: She is oriented to person, place, and time. She appears well-developed and well-nourished. No distress.  HENT:  Head: Normocephalic and atraumatic.  Cardiovascular: Normal rate, regular rhythm and normal heart sounds.   Respiratory: Effort normal and breath sounds normal. No respiratory distress.  GI: Soft. Bowel sounds are normal. She exhibits no distension and no mass. There is no tenderness. There is no rebound and no guarding.  Genitourinary: There is breast swelling and tenderness. No breast discharge or bleeding. There  is bleeding (small amount of bleeding noted) around the vagina. Vaginal discharge (moderate amount of chocolate discharge noted at the introitus on exam with mucus) found.  Neurological: She is alert and oriented to person, place, and time.  Skin: Skin is warm and dry. No rash noted. No erythema.  Psychiatric: She has a normal mood and affect.   Results for orders placed during the hospital encounter of 07/02/13 (from the past 24 hour(s))  CBC     Status: Abnormal   Collection Time    07/02/13 10:05 PM      Result Value Range   WBC 16.7 (*) 4.0 - 10.5 K/uL   RBC 4.07  3.87 - 5.11 MIL/uL   Hemoglobin 9.4 (*) 12.0 - 15.0  g/dL   HCT 16.1 (*) 09.6 - 04.5 %   MCV 67.6 (*) 78.0 - 100.0 fL   MCH 23.1 (*) 26.0 - 34.0 pg   MCHC 34.2  30.0 - 36.0 g/dL   RDW 40.9  81.1 - 91.4 %   Platelets 237  150 - 400 K/uL     MAU Course  Procedures None  MDM Discussed patient with Dr. Shawnie Pons. Rx for Augmentin BID x 7 days. Ok to given Rocephin IM here tonight.  Discussed Rocephin dose with pharmacist. Recommends coverage for Chlamydia as well as part of the treatment for delayed onset endometritis Patient advised to continue pumping some until milk supply decreases to relieve pain. Advised of use of cabbage leaves for drying up milk supply. Also advised to contact lactation is necessary.  Assessment and Plan  A: Postpartum endometritis  P: Discharge home Patient treated with Rocpehin and Zirthomax in MAU tonight Rx for Augmentin sent to patient's pharmacy Patient advised to monitor discharge, pain and fever and return if symptoms persist or fail to improve Patient advised to follow-up with [facility] for 6 week PP visit Patient may return to MAU as needed  Freddi Starr, PA-C  07/02/2013, 9:20 PM

## 2013-07-03 NOTE — MAU Provider Note (Signed)
Chart reviewed and agree with management and plan.  

## 2013-08-10 ENCOUNTER — Ambulatory Visit (INDEPENDENT_AMBULATORY_CARE_PROVIDER_SITE_OTHER): Payer: Medicaid Other | Admitting: Obstetrics & Gynecology

## 2013-08-10 ENCOUNTER — Encounter: Payer: Self-pay | Admitting: Obstetrics and Gynecology

## 2013-08-10 ENCOUNTER — Encounter: Payer: Self-pay | Admitting: *Deleted

## 2013-08-10 VITALS — BP 139/85 | HR 68 | Ht 63.0 in | Wt 201.9 lb

## 2013-08-10 DIAGNOSIS — Z01812 Encounter for preprocedural laboratory examination: Secondary | ICD-10-CM

## 2013-08-10 DIAGNOSIS — Z3049 Encounter for surveillance of other contraceptives: Secondary | ICD-10-CM

## 2013-08-10 DIAGNOSIS — Z30017 Encounter for initial prescription of implantable subdermal contraceptive: Secondary | ICD-10-CM

## 2013-08-10 MED ORDER — ETONOGESTREL 68 MG ~~LOC~~ IMPL
68.0000 mg | DRUG_IMPLANT | Freq: Once | SUBCUTANEOUS | Status: AC
  Administered 2013-08-10: 68 mg via SUBCUTANEOUS

## 2013-08-10 NOTE — Progress Notes (Signed)
Patient ID: Tracy Stone, female   DOB: 1991/07/22, 22 y.o.   MRN: [id number] Subjective:     Tracy Stone is a 22 y.o. female who presents for a postpartum visit. She is 6 week postpartum following a spontaneous vaginal delivery. I have fully reviewed the prenatal and intrapartum course. The delivery was at term. Outcome: spontaneous vaginal delivery.Bowel function is normal. Bladder function is normal. Patient is not sexually active. Contraception method is desires Nexplanon. Postpartum depression screening: negative.  The following portions of the patient's history were reviewed and updated as appropriate: allergies, current medications, past family history, past medical history, past social history, past surgical history and problem list.  Review of Systems Pertinent items are noted in HPI.   Objective:    BP 139/85  Pulse 68  Ht 5\' 3"  (1.6 m)  Wt 201 lb 14.4 oz (91.581 kg)  BMI 35.77 kg/m2  Breastfeeding? No  General:  alert and no distress     Lungs: clear to auscultation bilaterally  Heart:  regular rate and rhythm, S1, S2 normal, no murmur, click, rub or gallop  Abdomen: soft, non-tender; bowel sounds normal; no masses,  no organomegaly                         Patient given informed consent, she signed consent form. Pregnancy test was negative.  Appropriate time out taken.  Patient's left arm was prepped and draped in the usual sterile fashion.. The ruler used to measure and mark insertion area.  Patient was prepped with alcohol swab and then injected with 5 ml of 1 % lidocaine.  She was prepped with betadine, Nexplanon removed from packaging,  Device confirmed in needle, then inserted full length of needle and withdrawn per handbook instructions.  There was minimal blood loss.  Patient insertion site covered with guaze and a pressure bandage to reduce any bruising.  The patient tolerated the procedure well and was given post procedure instructions. Return in about one month  for Nexplanon check.    Assessment:    postpartum exam. Pap smear not done at today's visit.   Plan:    1. Contraception: Nexplanon plaed today 2. Follow up in: 4 weeks or as needed.

## 2013-08-10 NOTE — Addendum Note (Signed)
Addended by: Kathee Delton on: 08/10/2013 03:21 PM   Modules accepted: Orders

## 2013-10-24 ENCOUNTER — Ambulatory Visit: Payer: Medicaid Other | Admitting: Advanced Practice Midwife

## 2014-03-03 ENCOUNTER — Emergency Department (HOSPITAL_COMMUNITY): Payer: Medicaid Other

## 2014-03-03 ENCOUNTER — Emergency Department (HOSPITAL_COMMUNITY)
Admission: EM | Admit: 2014-03-03 | Discharge: 2014-03-03 | Disposition: A | Discharge status: Home / Self Care | Payer: Medicaid Other | Attending: Emergency Medicine | Admitting: Emergency Medicine

## 2014-03-03 ENCOUNTER — Encounter (HOSPITAL_COMMUNITY): Payer: Self-pay | Admitting: Emergency Medicine

## 2014-03-03 DIAGNOSIS — Z79899 Other long term (current) drug therapy: Secondary | ICD-10-CM | POA: Insufficient documentation

## 2014-03-03 DIAGNOSIS — R109 Unspecified abdominal pain: Secondary | ICD-10-CM

## 2014-03-03 DIAGNOSIS — I880 Nonspecific mesenteric lymphadenitis: Secondary | ICD-10-CM | POA: Insufficient documentation

## 2014-03-03 DIAGNOSIS — R1032 Left lower quadrant pain: Secondary | ICD-10-CM | POA: Insufficient documentation

## 2014-03-03 DIAGNOSIS — Z3202 Encounter for pregnancy test, result negative: Secondary | ICD-10-CM | POA: Insufficient documentation

## 2014-03-03 DIAGNOSIS — R1031 Right lower quadrant pain: Secondary | ICD-10-CM | POA: Insufficient documentation

## 2014-03-03 DIAGNOSIS — J45909 Unspecified asthma, uncomplicated: Secondary | ICD-10-CM | POA: Insufficient documentation

## 2014-03-03 LAB — COMPREHENSIVE METABOLIC PANEL
ALBUMIN: 4 g/dL (ref 3.5–5.2)
ALK PHOS: 59 U/L (ref 39–117)
ALT: 18 U/L (ref 0–35)
AST: 29 U/L (ref 0–37)
BUN: 11 mg/dL (ref 6–23)
CHLORIDE: 102 meq/L (ref 96–112)
CO2: 20 mEq/L (ref 19–32)
Calcium: 8.9 mg/dL (ref 8.4–10.5)
Creatinine, Ser: 0.74 mg/dL (ref 0.50–1.10)
GFR calc non Af Amer: >90 mL/min (ref >90)
GLUCOSE: 85 mg/dL (ref 70–99)
POTASSIUM: 4.4 meq/L (ref 3.7–5.3)
SODIUM: 137 meq/L (ref 137–147)
TOTAL PROTEIN: 7.6 g/dL (ref 6.0–8.3)
Total Bilirubin: 0.4 mg/dL (ref 0.3–1.2)

## 2014-03-03 LAB — URINALYSIS, ROUTINE W REFLEX MICROSCOPIC
Bilirubin Urine: NEGATIVE
GLUCOSE, UA: NEGATIVE mg/dL
Ketones, ur: NEGATIVE mg/dL
Leukocytes, UA: NEGATIVE
Nitrite: NEGATIVE
PH: 5.5 (ref 5.0–8.0)
Protein, ur: NEGATIVE mg/dL
SPECIFIC GRAVITY, URINE: 1.014 (ref 1.005–1.030)
Urobilinogen, UA: 0.2 mg/dL (ref 0.0–1.0)

## 2014-03-03 LAB — LIPASE, BLOOD: Lipase: 28 U/L (ref 11–59)

## 2014-03-03 LAB — URINE MICROSCOPIC-ADD ON

## 2014-03-03 LAB — CBC WITH DIFFERENTIAL/PLATELET
BASOS ABS: 0 10*3/uL (ref 0.0–0.1)
Basophils Relative: 0 % (ref 0–1)
EOS PCT: 3 % (ref 0–5)
Eosinophils Absolute: 0.3 10*3/uL (ref 0.0–0.7)
HEMATOCRIT: 36.5 % (ref 36.0–46.0)
HEMOGLOBIN: 12.7 g/dL (ref 12.0–15.0)
LYMPHS PCT: 33 % (ref 12–46)
Lymphs Abs: 2.8 10*3/uL (ref 0.7–4.0)
MCH: 22.8 pg — ABNORMAL LOW (ref 26.0–34.0)
MCHC: 34.8 g/dL (ref 30.0–36.0)
MCV: 65.5 fL — ABNORMAL LOW (ref 78.0–100.0)
MONOS PCT: 4 % (ref 3–12)
Monocytes Absolute: 0.3 10*3/uL (ref 0.1–1.0)
NEUTROS ABS: 5.1 10*3/uL (ref 1.7–7.7)
Neutrophils Relative %: 60 % (ref 43–77)
Platelets: 317 10*3/uL (ref 150–400)
RBC: 5.57 MIL/uL — AB (ref 3.87–5.11)
RDW: 14.3 % (ref 11.5–15.5)
WBC: 8.5 10*3/uL (ref 4.0–10.5)

## 2014-03-03 LAB — POC URINE PREG, ED: Preg Test, Ur: NEGATIVE

## 2014-03-03 MED ORDER — IOHEXOL 300 MG/ML  SOLN
100.0000 mL | Freq: Once | INTRAMUSCULAR | Status: AC | PRN
  Administered 2014-03-03: 100 mL via INTRAVENOUS

## 2014-03-03 MED ORDER — MORPHINE SULFATE 4 MG/ML IJ SOLN
4.0000 mg | Freq: Once | INTRAMUSCULAR | Status: AC
  Administered 2014-03-03: 4 mg via INTRAVENOUS
  Filled 2014-03-03: qty 1

## 2014-03-03 MED ORDER — HYDROCODONE-ACETAMINOPHEN 5-325 MG PO TABS
2.0000 | ORAL_TABLET | Freq: Once | ORAL | Status: AC
  Administered 2014-03-03: 2 via ORAL
  Filled 2014-03-03: qty 2

## 2014-03-03 MED ORDER — IOHEXOL 300 MG/ML  SOLN
25.0000 mL | Freq: Once | INTRAMUSCULAR | Status: AC | PRN
  Administered 2014-03-03: 25 mL via ORAL

## 2014-03-03 MED ORDER — HYDROCODONE-ACETAMINOPHEN 5-325 MG PO TABS: 1.0000 | ORAL_TABLET | ORAL | Status: AC | PRN

## 2014-03-03 MED ORDER — ONDANSETRON HCL 4 MG/2ML IJ SOLN
4.0000 mg | Freq: Once | INTRAMUSCULAR | Status: AC
  Administered 2014-03-03: 4 mg via INTRAVENOUS
  Filled 2014-03-03: qty 2

## 2014-03-03 NOTE — ED Provider Notes (Signed)
Medical screening examination/treatment/procedure(s) were performed by non-physician practitioner and as supervising physician I was immediately available for consultation/collaboration.   EKG Interpretation None        Charles B. Bernette MayersSheldon, MD 03/03/14 [phone]

## 2014-03-03 NOTE — Discharge Instructions (Signed)
Please read the information below regarding your diagnosis. Your CT scan showed some  Inflammation of the lymph nodes.  There is no sign of infection and your CT scan and labwork are otherwise normal. Please use the pain medication with care if you are breastfeeding because it can be passed through the milk  Abdominal (belly) pain can be caused by many things. Your caregiver performed an examination and possibly ordered blood/urine tests and imaging (CT scan, x-rays, ultrasound). Many cases can be observed and treated at home after initial evaluation in the emergency department. Even though you are being discharged home, abdominal pain can be unpredictable. Therefore, you need a repeated exam if your pain does not resolve, returns, or worsens. Most patients with abdominal pain don't have to be admitted to the hospital or have surgery, but serious problems like appendicitis and gallbladder attacks can start out as nonspecific pain. Many abdominal conditions cannot be diagnosed in one visit, so follow-up evaluations are very important. SEEK IMMEDIATE MEDICAL ATTENTION IF: The pain does not go away or becomes severe.  A temperature above 101 develops.  Repeated vomiting occurs (multiple episodes).  The pain becomes localized to portions of the abdomen. The right side could possibly be appendicitis. In an adult, the left lower portion of the abdomen could be colitis or diverticulitis.  Blood is being passed in stools or vomit (bright red or black tarry stools).  Return also if you develop chest pain, difficulty breathing, dizziness or fainting, or become confused, poorly responsive, or inconsolable (young children).  Mesenteric Adenitis Mesenteric adenitis is an inflammation of lymph nodes (glands) in the abdomen. It may appear to mimic appendicitis symptoms. It is most common in children. The cause of this may be an infection somewhere else in the body. It usually gets well without treatment but can  cause problems for up to a couple weeks. SYMPTOMS  The most common problems are:  Fever.  Abdominal pain and tenderness.  Nausea, vomiting, and/or diarrhea. DIAGNOSIS  Your caregiver may have an idea what is wrong by examining you or your child. Sometimes lab work and other studies such as Ultrasonography and a CT scan of the abdomen are done.  TREATMENT  Children with mesenteric adenitis will get well without further treatment. Treatment includes rest, pain medications, and fluids. HOME CARE INSTRUCTIONS   Do not take or give laxatives unless ordered by your caregiver.  Use pain medications as directed.  Follow the diet recommended by your caregiver. SEEK IMMEDIATE MEDICAL CARE IF:   The pain does not go away or becomes severe.  An oral temperature above 102 F (38.9 C) develops.  Repeated vomiting occurs.  The pain becomes localized in the right lower quadrant of the abdomen (possibly appendicitis).  You or your child notice bright red or black tarry stools. MAKE SURE YOU:   Understand these instructions.  Will watch your condition.  Will get help right away if you are not doing well or get worse. Document Released: 08/20/2006 Document Revised: 02/08/2012 Document Reviewed: 09/02/2006 [facility] Patient Information 2014 [place], [place].  Acetaminophen; Hydrocodone tablets or capsules What is this medicine? ACETAMINOPHEN; HYDROCODONE (a set a MEE noe fen; hye droe KOE done) is a pain reliever. It is used to treat mild to moderate pain. This medicine may be used for other purposes; ask your health care provider or pharmacist if you have questions. COMMON BRAND NAME(S): Anexsia, Bancap HC , Ceta-Plus, Co-Gesic, Comfortpak , Dolagesic, Dolorex Forte, DuoCet , Hydrocet , Hydrogesic, [place] HD,  Lorcet Plus, Lorcet, Lortab, Margesic H, Maxidone, Norco, Polygesic, Stagesic, ReginaVanacet, Vicodin ES, Vicodin HP, Vicodin, [place] BasemanXodol, Zydone What should I tell my health care provider before  I take this medicine? They need to know if you have any of these conditions: -brain tumor -Crohn's disease, inflammatory bowel disease, or ulcerative colitis -drug abuse or addiction -head injury -heart or circulation problems -if you often drink alcohol -kidney disease or problems going to the bathroom -liver disease -lung disease, asthma, or breathing problems -an unusual or allergic reaction to acetaminophen, hydrocodone, other opioid analgesics, other medicines, foods, dyes, or preservatives -pregnant or trying to get pregnant -breast-feeding How should I use this medicine? Take this medicine by mouth. Swallow it with a full glass of water. Follow the directions on the prescription label. If the medicine upsets your stomach, take the medicine with food or milk. Do not take more than you are told to take. Talk to your pediatrician regarding the use of this medicine in children. This medicine is not approved for use in children. Overdosage: If you think you have taken too much of this medicine contact a poison control center or emergency room at once. NOTE: This medicine is only for you. Do not share this medicine with others. What if I miss a dose? If you miss a dose, take it as soon as you can. If it is almost time for your next dose, take only that dose. Do not take double or extra doses. What may interact with this medicine? -alcohol -antihistamines -isoniazid -medicines for depression, anxiety, or psychotic disturbances -medicines for sleep -muscle relaxants -naltrexone -narcotic medicines (opiates) for pain -phenobarbital -ritonavir -tramadol This list may not describe all possible interactions. Give your health care provider a list of all the medicines, herbs, non-prescription drugs, or dietary supplements you use. Also tell them if you smoke, drink alcohol, or use illegal drugs. Some items may interact with your medicine. What should I watch for while using this  medicine? Tell your doctor or health care professional if your pain does not go away, if it gets worse, or if you have new or a different type of pain. You may develop tolerance to the medicine. Tolerance means that you will need a higher dose of the medicine for pain relief. Tolerance is normal and is expected if you take the medicine for a long time. Do not suddenly stop taking your medicine because you may develop a severe reaction. Your body becomes used to the medicine. This does NOT mean you are addicted. Addiction is a behavior related to getting and using a drug for a non-medical reason. If you have pain, you have a medical reason to take pain medicine. Your doctor will tell you how much medicine to take. If your doctor wants you to stop the medicine, the dose will be slowly lowered over time to avoid any side effects. You may get drowsy or dizzy when you first start taking the medicine or change doses. Do not drive, use machinery, or do anything that may be dangerous until you know how the medicine affects you. Stand or sit up slowly. There are different types of narcotic medicines (opiates) for pain. If you take more than one type at the same time, you may have more side effects. Give your health care provider a list of all medicines you use. Your doctor will tell you how much medicine to take. Do not take more medicine than directed. Call emergency for help if you have problems breathing. The medicine  will cause constipation. Try to have a bowel movement at least every 2 to 3 days. If you do not have a bowel movement for 3 days, call your doctor or health care professional. Too much acetaminophen can be very dangerous. Do not take Tylenol (acetaminophen) or medicines that contain acetaminophen with this medicine. Many non-prescription medicines contain acetaminophen. Always read the labels carefully. What side effects may I notice from receiving this medicine? Side effects that you should report  to your doctor or health care professional as soon as possible: -allergic reactions like skin rash, itching or hives, swelling of the face, lips, or tongue -breathing problems -confusion -feeling faint or lightheaded, falls -stomach pain -yellowing of the eyes or skin Side effects that usually do not require medical attention (report to your doctor or health care professional if they continue or are bothersome): -nausea, vomiting -stomach upset This list may not describe all possible side effects. Call your doctor for medical advice about side effects. You may report side effects to FDA at 1-800-FDA-1088. Where should I keep my medicine? Keep out of the reach of children. This medicine can be abused. Keep your medicine in a safe place to protect it from theft. Do not share this medicine with anyone. Selling or giving away this medicine is dangerous and against the law. Store at room temperature between 15 and 30 degrees C (59 and 86 degrees F). Protect from light. Keep container tightly closed.  Throw away any unused medicine after the expiration date. Discard unused medicine and used packaging carefully. Pets and children can be harmed if they find used or lost packages. NOTE: This sheet is a summary. It may not cover all possible information. If you have questions about this medicine, talk to your doctor, pharmacist, or health care provider.  2014, Elsevier/Gold Standard. (2013-07-10 13:15:56)    Emergency Department Resource Guide 1) Find a Doctor and Pay Out of Pocket Although you won't have to find out who is covered by your insurance plan, it is a good idea to ask around and get recommendations. You will then need to call the office and see if the doctor you have chosen will accept you as a new patient and what types of options they offer for patients who are self-pay. Some doctors offer discounts or will set up payment plans for their patients who do not have insurance, but you will  need to ask so you aren't surprised when you get to your appointment.  2) Contact Your Local Health Department Not all health departments have doctors that can see patients for sick visits, but many do, so it is worth a call to see if yours does. If you don't know where your local health department is, you can check in your phone book. The CDC also has a tool to help you locate your state's health department, and many state websites also have listings of all of their local health departments.  3) Find a Walk-in Clinic If your illness is not likely to be very severe or complicated, you may want to try a walk in clinic. These are popping up all over the country in pharmacies, drugstores, and shopping centers. They're usually staffed by nurse practitioners or physician assistants that have been trained to treat common illnesses and complaints. They're usually fairly quick and inexpensive. However, if you have serious medical issues or chronic medical problems, these are probably not your best option.  No Primary Care Doctor:  Call Health Connect at  [phone] -  they can help you locate a primary care doctor that  accepts your insurance, provides certain services, etc. - Physician Referral Service- [phone]  Chronic Pain Problems: Organization         Address  Phone   Notes  [facility]  [phone] Patients need to be referred by their primary care doctor.   Medication Assistance: Organization         Address  Phone   Notes  [facility] [address] [place], [place] [address] [phone] --Must be a resident of [facility] -- Must have NO insurance coverage whatsoever (no Medicaid/ Medicare, etc.) -- The pt. MUST have a primary care doctor that directs their care regularly and follows them in the community   [facility]  [phone]   [employer]  [phone]    Agencies that provide inexpensive medical  care: Organization         Address  Phone   Notes  [facility]  [phone]   [facility]    [phone]   [facility] [address] [place], [place] [address] [phone]   [facility] [address] [place] [place] [phone]   Planned Parenthood    [phone]   [facility]    [phone]   [facility] and [facility]  [address], [place] Phone:  [phone], Fax:  [phone] Hours of Operation:  9 am - 6 pm, M-F.  Also accepts Medicaid/Medicare and self-pay.  [facility]  [address], [place] Phone: [phone], Fax: [phone]. Hours of Operation:  8:30 am - 5:30 pm, M-F.  Also accepts Medicaid and self-pay.  [facility] [place] [address] [place] Phone: [phone]   [facility] [address] Natasha Bence [place], [place] [phone], Ext. [phone] Mondays & Thursdays: 7-9 AM.  First 15 patients are seen on a first come, first serve basis.    Medicaid-accepting [facility] Providers:  Organization         Address  Phone   Notes  [facility] [address],  [phone] Also accepts self-pay patients.  [facility] [address] Laurell Josephs [place], [place]  [phone]   [facility] [address], [place] [phone]   [facility] [address], [place] [phone]   Renaye Rakers [address], [place]   [phone] Only accepts [place] Access [place] patients after they have their name applied to their card.   Self-Pay (no insurance) in [employer]:  Organization         Address  Phone   Notes  Sickle Cell Patients, [facility] [address] [place], [place] [phone]   [facility] [address] [place],  [place] [phone]   [facility] [place]  [address], [place] [phone]   [facility]/Dr. Osei-Bonsu  [address], [place] or [address], [place] [phone] Phone number for both [place] and [place] locations is the same.  [facility] [address] [place] [phone]   [facility] [address], [place] or [place]  [facility] [address] [phone]   [facility] [address] [place], [place] [phone], phone; [phone], fax Sees patients 1st and 3rd Saturday of every month.  Must not qualify for public or private insurance (i.e. Medicaid, Medicare, [place] Health Choice, Veterans' Benefits)  Household income should be no more than 200% of the poverty level The clinic cannot treat you if you are pregnant or think you are pregnant  Sexually transmitted diseases are not treated at the clinic.    Dental Care: Organization         Address  Phone  Notes  [facility] Department of [facility] [address] [place], [place] [phone] Accepts children up to age 38 who are enrolled in [place] or [place] Health Choice; pregnant women with a Medicaid card; and children who have applied for Medicaid or [place] Health Choice, but were declined, whose parents can pay a reduced fee at time of service.  [facility]  [address], [place] [phone] Accepts children up to age 27 who are enrolled in [place] or [place] Health Choice; pregnant women with a Medicaid card; and children who have applied for Medicaid or [place] Health Choice, but were declined, whose parents can pay a reduced fee at time of service.  [facility]  [address] [place], [place] [phone] Patients are seen by appointment only. Walk-ins are not accepted. [facility] will see patients 63 years of age and older. Monday - Tuesday (8am-5pm) Most Wednesdays (8:30-5pm) $30 per visit, cash only  [facility]  [address], [facility] [phone] Patients are seen by appointment only. Walk-ins are not accepted. [facility] will see patients 80 years of age and older. One Wednesday Evening (Monthly: Volunteer Based).  $30 per visit, cash only  [employer]  [phone] for adults; Children under age 12, call Graduate Pediatric Dentistry at [phone]. Children aged 54-14, please call [phone] to request a pediatric application.  Dental services are provided in all areas of dental care including fillings, crowns and bridges, complete and partial dentures, implants, gum treatment, root canals, and extractions. Preventive care is also provided. Treatment is provided to both adults and children. Patients are selected via a lottery and there is often a waiting list.   [facility] [address], [place]  [phone] [email or web address]   [facility] [address] [place], [place] [phone], Ext. [phone] Second and Fourth Thursday of each month, opens at 6:30 AM; Clinic ends at 9 AM.  Patients are seen on a first-come first-served basis, and a limited number are seen during each clinic.   [facility]  [address] Ether Griffins [place], [place] [phone]   Eligibility Requirements You must have lived in [place], [place], or [place] counties for at least the last three months.   You cannot be eligible for state or federal sponsored [place], including [employer], [place], or [employer].   You generally cannot be eligible for healthcare insurance through your employer.    How to apply: Eligibility screenings are held every Tuesday and Wednesday afternoon from 1:00 pm until 4:00 pm. You do not need an appointment for the interview!   [facility] [address], [place], [place] [phone]   [facility] Health Department  [phone]   [facility]  Health Department  [phone]   [facility] Health Department  [phone]    Behavioral Health Resources in the Community: Intensive Outpatient Programs Organization         Address  Phone  Notes  [facility] [address], [place], [place] [phone]   [facility] Outpatient [address] [place], [place] [phone]   [facility] [address] [place], [place]  [phone]   [facility] Mental Health [address]  [place], [place] [phone] or [phone]   Substance Abuse Resources Organization         Address  Phone  Notes  [facility]  [phone]   [facility]  [phone]   [facility] [place]  [phone]   Floydene Flock  [phone]   Residential & Outpatient Substance Abuse Program  [phone]   Psychological Services Organization         Address  Phone  Notes  [facility]  [phone]   [facility]  [phone]   [facility] [address], [place] [phone] or [phone]    Mobile Crisis Teams Organization         Address  Phone  Notes  [facility], Mobile Crisis Care Unit  [phone]   [facility]  [address] [place], [place] [phone]   Doristine Locks [address] [place] [phone]    Self-Help/Support Groups Organization         Address  Phone             Notes  [facility] [place] - variety of support groups  336- I7437963 Call for more information  Narcotics Anonymous (NA), [facility] [address], [employer] [place]  2 meetings at this location   Statistician         Address  Phone  Notes  [facility] [address],    [place]  [phone]   [facility]  [address] [place] [address], [place], [place] [phone]   [facility] [address] [place], [place] [phone] Admissions: 8am-3pm M-F  [facility] [address],    [place], [place] [phone]   [facility] [address] [place], [place], [place] [phone]   [facility] [address],  [place], [place] [phone]   [facility] - Intensive Outpatient [address] Laurell Josephs [address], [place], [place] [phone]   [facility] ([facility] [address] [place]  [place], [place] [phone] or [phone]   [facility] ([facility]) [address], [place], [place] [phone] Accepts Medicaid  [facility] [address],  [place] [phone] Substance Abuse/Addiction Treatment   [facility] Organization         Address  Phone  Notes  [facility]  [phone]   Angie Fava, PhD [address] Ervin [place], [place]   [phone] or [phone]   [facility]   [address] [place], [place] [phone]   [facility] [address], [place], [place] [phone] Insurance/Medicaid/sponsorship through [employer] and Families [address]  [place], [place] [phone] [place], [place] [phone]    Dr. Adele Schilder  [phone]   [facility] Dept. 1) [address] [place] 2) [place] 3)  [place] [address], [place] [phone]  [phone]   [place] [phone] or [phone] (After Hours)

## 2014-03-03 NOTE — ED Provider Notes (Signed)
CSN: [id number]     Arrival date & time 03/03/14  1102 History   First MD Initiated Contact with Patient 03/03/14 1132     Chief Complaint  Patient presents with  . Abdominal Pain     (Consider location/radiation/quality/duration/timing/severity/associated sxs/prior Treatment) HPI  Tracy Stone 23 year old female who presents the emergency department with chief complaint of right-sided abdominal pain.  She is 8 months status post normal vaginal delivery of a healthy female.  She did have an episode of postpartum endometritis after her delivery.  The patient presents today with chief complaint of right and left lower quadrant abdominal pain.  She states that when she this morning she had 8/10 abdominal pain.  She's never had a pain like this before.  Pain worsened and then alleviate.  However is still constant.  She denies any nausea or vomiting.  She has no previous history of abdominal surgeries.  She tried nothing for the pain at home.  Last bowel movement was last night.  She denies any constipation.  Patient denies any urinary or vaginal symptoms.  She denies any history of kidney stones or back pain. Denies fevers, chills, myalgias, arthralgias. Denies DOE, SOB, chest tightness or pressure, radiation to left arm, jaw or back, or diaphoresis.   Past Medical History  Diagnosis Date  . Asthma    Past Surgical History  Procedure Laterality Date  . No past surgeries     Family History  Problem Relation Age of Onset  . Hypertension Mother   . Diabetes Maternal Grandmother    History  Substance Use Topics  . Smoking status: Never Smoker   . Smokeless tobacco: Never Used  . Alcohol Use: No   OB History   Grav Para Term Preterm Abortions TAB SAB Ect Mult Living   1 1 1  0 0 0 0 0 0 1     Review of Systems  Ten systems reviewed and are negative for acute change, except as noted in the HPI.    Allergies  Ibuprofen  Home Medications   Current Outpatient Rx  Name  Route   Sig  Dispense  Refill  . albuterol (PROVENTIL HFA;VENTOLIN HFA) 108 (90 BASE) MCG/ACT inhaler   Inhalation   Inhale 2 puffs into the lungs every 6 (six) hours as needed. For shortness of breath   6.7 g   0    BP 129/74  Pulse 78  Temp(Src) 98.4 F (36.9 C) (Oral)  Resp 16  Ht 5\' 3"  (1.6 m)  Wt 235 lb 12.8 oz (106.958 kg)  BMI 41.78 kg/m2  SpO2 99%  Breastfeeding? No Physical Exam  Constitutional: She is oriented to person, place, and time. She appears well-developed and well-nourished. No distress.  HENT:  Head: Normocephalic and atraumatic.  Eyes: Conjunctivae are normal. No scleral icterus.  Neck: Normal range of motion.  Cardiovascular: Normal rate, regular rhythm and normal heart sounds.  Exam reveals no gallop and no friction rub.   No murmur heard. Pulmonary/Chest: Effort normal and breath sounds normal. No respiratory distress.  Abdominal: Soft. Bowel sounds are normal. She exhibits no distension and no mass. There is tenderness. There is no guarding.  Right and left lower quadrant abdominal pain.  No rebound tenderness, no Rovsing sign.  Neurological: She is alert and oriented to person, place, and time.  Skin: Skin is warm and dry. She is not diaphoretic.    ED Course  Procedures (including critical care time) Labs Review Labs Reviewed  CBC WITH DIFFERENTIAL -  Abnormal; Notable for the following:    RBC 5.57 (*)    MCV 65.5 (*)    MCH 22.8 (*)    All other components within normal limits  COMPREHENSIVE METABOLIC PANEL  LIPASE, BLOOD  URINALYSIS, ROUTINE W REFLEX MICROSCOPIC  POC URINE PREG, ED   Imaging Review No results found.   EKG Interpretation None      MDM   Final diagnoses:  Abdominal pain  Nonspecific mesenteric adenitis    Patient with abdomi9nal pain . ddx includes, appendicitis, cholecysititis,constipation. Less likely colitis. CT abdomena pending,.   Patient with mild mesenteric adenitis of the Right abdomen No other acute  abnormalities.  Patient is nontoxic, nonseptic appearing, in no apparent distress.  Patient's pain and other symptoms adequately managed in emergency department.  Fluid bolus given.  Labs, imaging and vitals reviewed.  Patient does not meet the SIRS or Sepsis criteria.  On repeat exam patient does not have a surgical abdomin and there are nor peritoneal signs.  No indication of appendicitis, bowel obstruction, bowel perforation, cholecystitis, diverticulitis, PID or ectopic pregnancy.  Patient discharged home with symptomatic treatment and given strict instructions for follow-up with their primary care physician.  I have also discussed reasons to return immediately to the ER.  Patient expresses understanding and agrees with plan.     Arthor Captain, PA-C 03/03/14 [phone]

## 2014-03-03 NOTE — ED Notes (Signed)
Pt c/o right sided abdominal pain onset when she woke up this morning. Pt denies N/V at present. Last BM was yesterday and normal. Pt denies discharge or urinary symptoms.

## 2014-03-03 NOTE — ED Notes (Signed)
Pt comfortable with discharge and follow up instructions. Prescriptions x1 

## 2014-03-03 NOTE — ED Notes (Signed)
CT notified that pt is finished with contrast.  

## 2014-03-20 ENCOUNTER — Other Ambulatory Visit: Payer: Self-pay | Admitting: Advanced Practice Midwife

## 2014-04-11 IMAGING — US US OB LIMITED
1 series · 14 of 27 positions shown · non-contrast
Comparison: none

CLINICAL DATA: Abdominal pain.

[Series 1: us ob limited · 0.30mm/px · 14 of 27 slices shown]
[im 1/27]
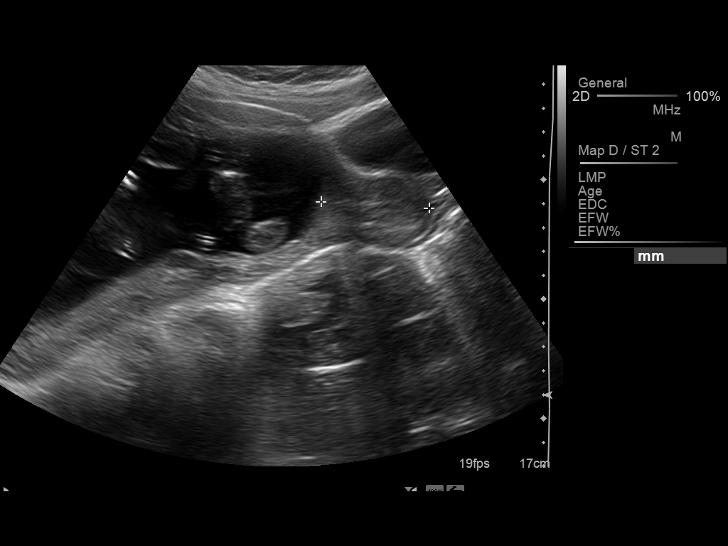
[im 3/27]
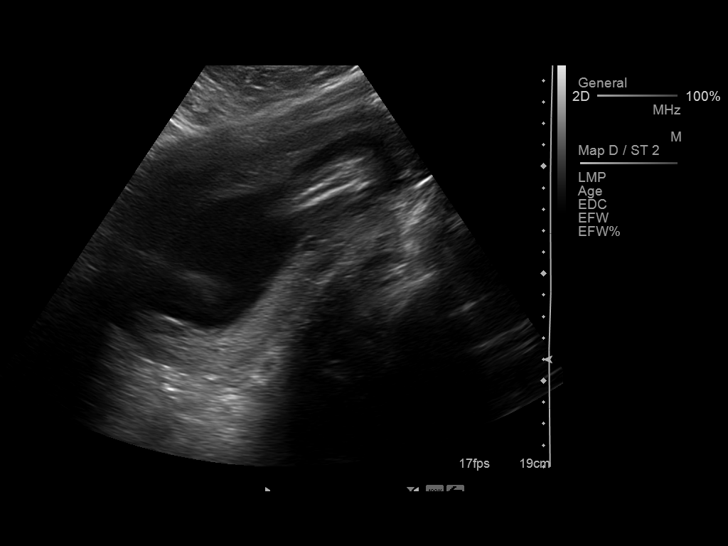
[im 5/27]
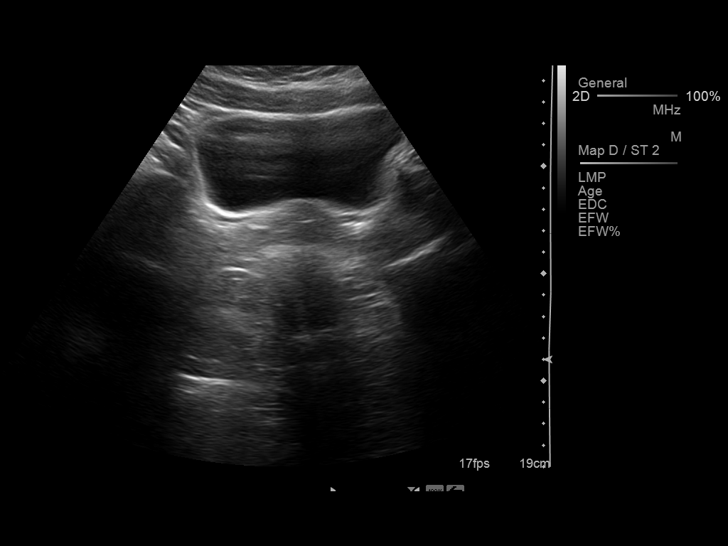
[im 7/27]
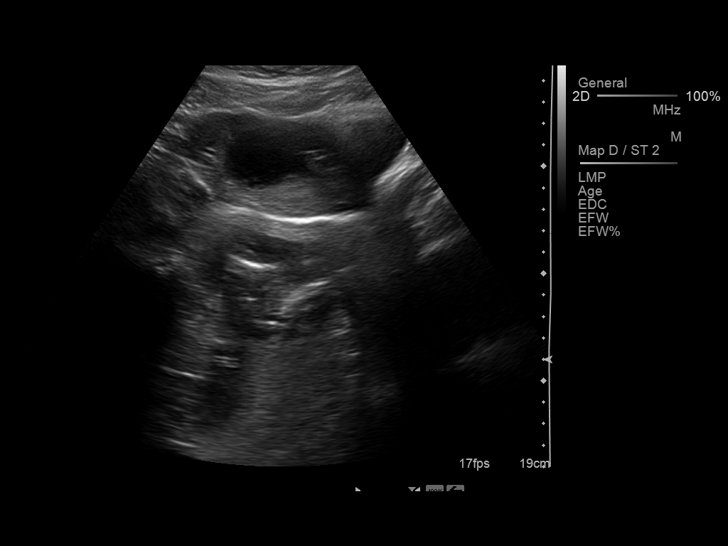
[im 9/27]
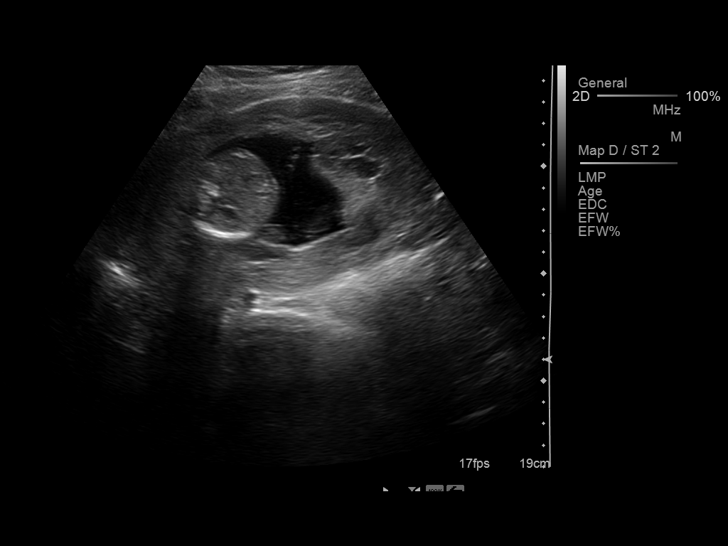
[im 11/27]
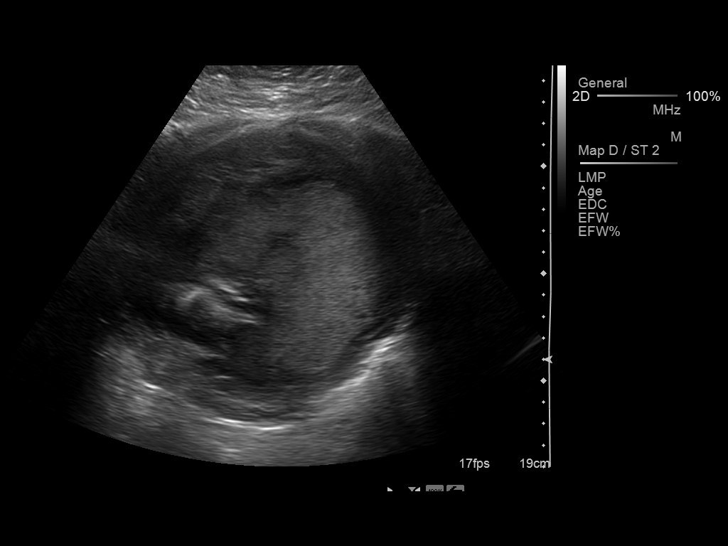
[im 13/27]
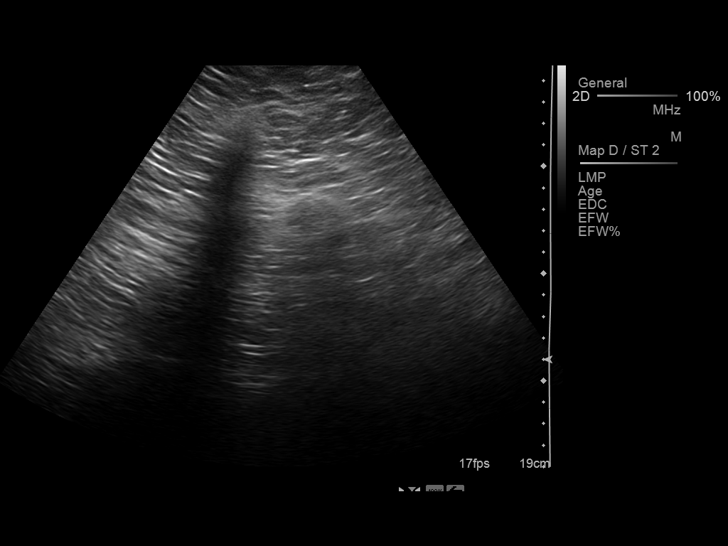
[im 15/27]
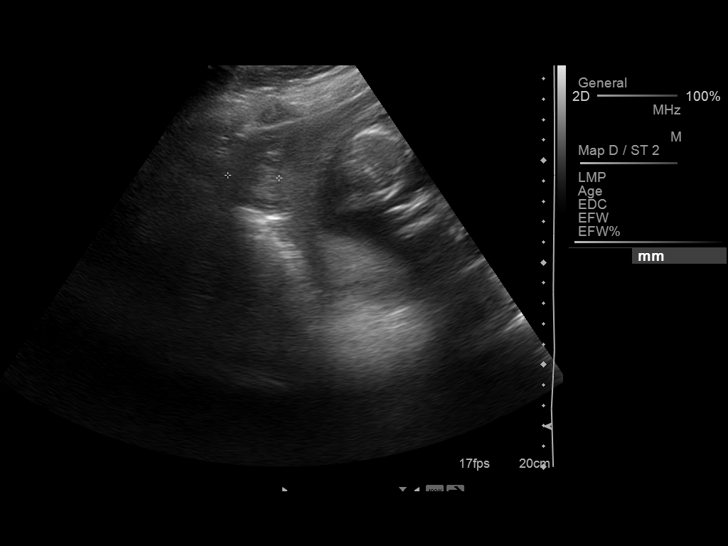
[im 17/27]
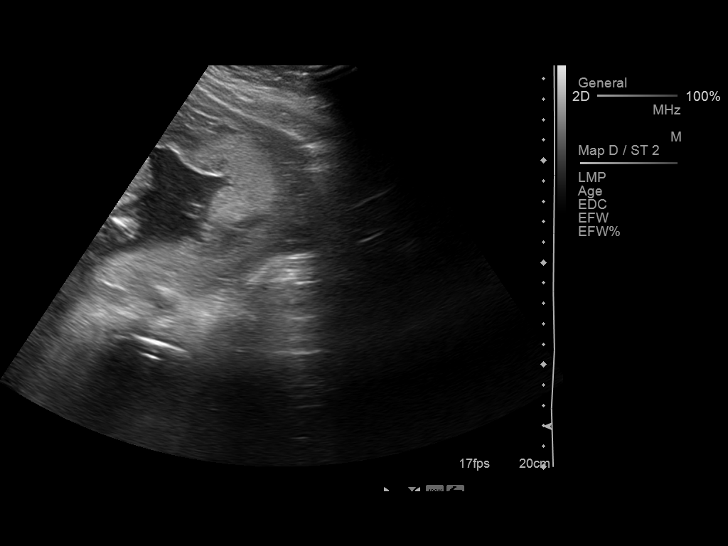
[im 19/27]
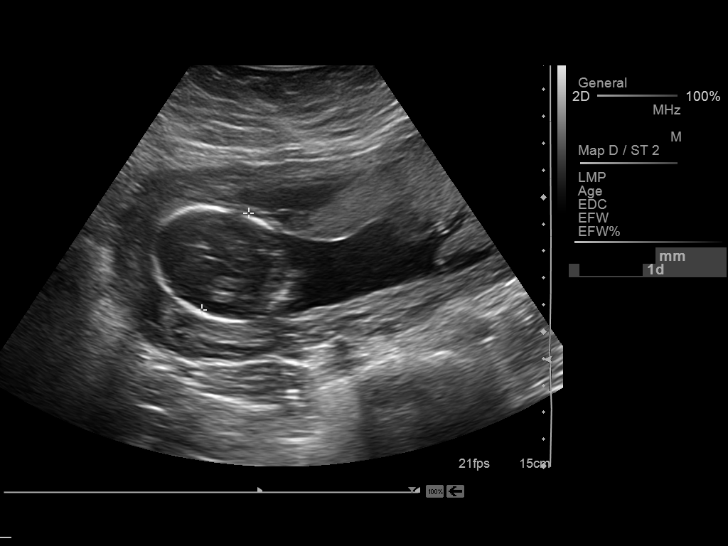
[im 21/27]
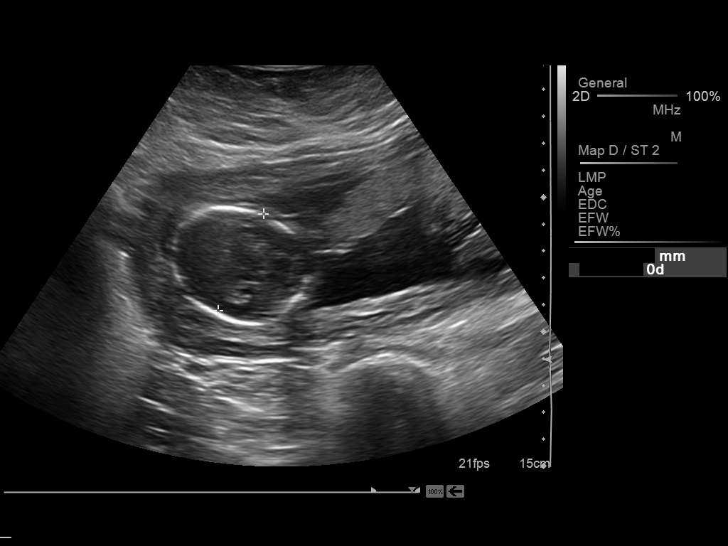
[im 23/27]
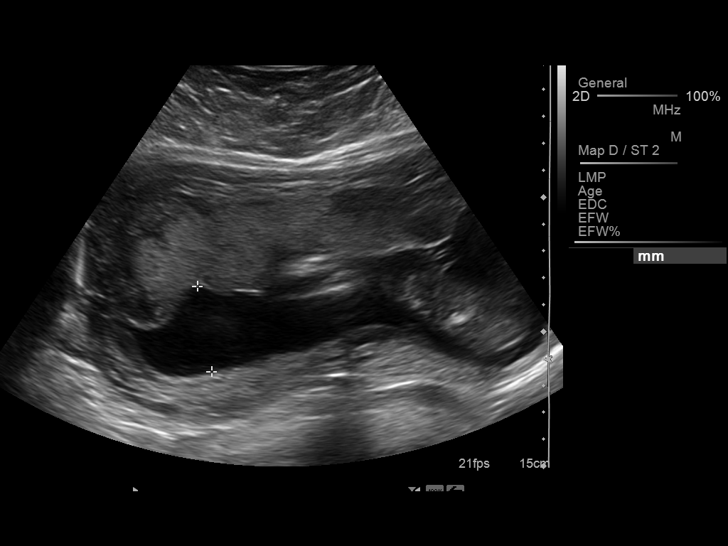
[im 25/27]
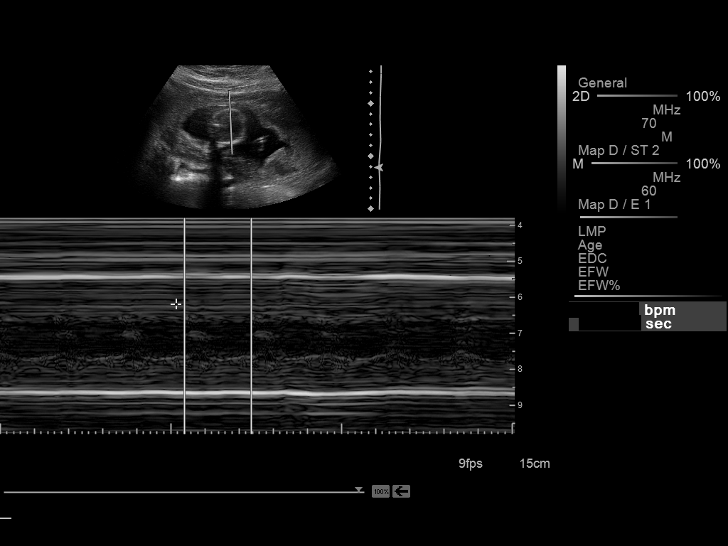
[im 27/27]
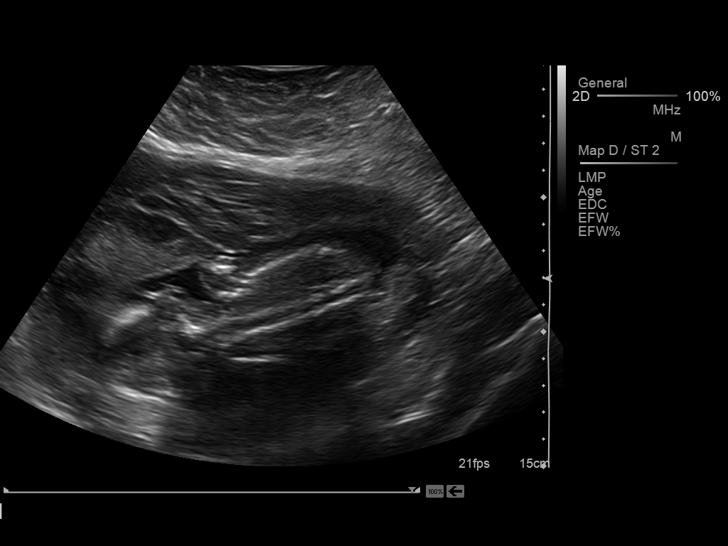

[14 of 27 positions shown; findings below may reference images not displayed]

LIMITED OBSTETRIC ULTRASOUND

Number of Fetuses: 1
Heart Rate: 153 bpm
Movement: Yes
Presentation: Breech
Placental Location: Fundal
Previa: No
Amniotic Fluid (Subjective): Normal

Vertical Pocket 3.9 cm     AFI 14.3 cm  (5%ile = 8.3 cm; 95%ile =
19.4 cm)

BPD:  39.8 mm     18 w  1 d         EDC:  07/04/2013

MATERNAL FINDINGS:
Cervix: Closed, measuring 4.5 cm in length.
Uterus/Adnexae:  The uterus is otherwise unremarkable in
appearance.  The right ovary measures 4.7 x 2.8 x 2.5 cm, while the
left ovary is not visualized.  No suspicious adnexal masses are
seen.
IMPRESSION: Single live intrauterine pregnancy noted, with a biparietal
diameter of 4.0 cm, corresponding to a gestational age of 18 weeks
1 day.  This reflects an estimated date of delivery July 04, 2013.  No evidence for placenta previa; subjectively normal amount
of amniotic fluid seen.  Cervix remains closed, measuring 4.5 cm.

Recommend followup with non-emergent complete OB 14+ wk US
examination for fetal biometric evaluation and anatomic survey if
not already performed.

## 2014-05-21 IMAGING — US US OB DETAIL+14 WK
1 series · 12 of 28 positions shown · non-contrast
Comparison: none

[Series 1: us ob detail +14 wk · 65 acquisitions, 12 frames shown]
[im 3/65]
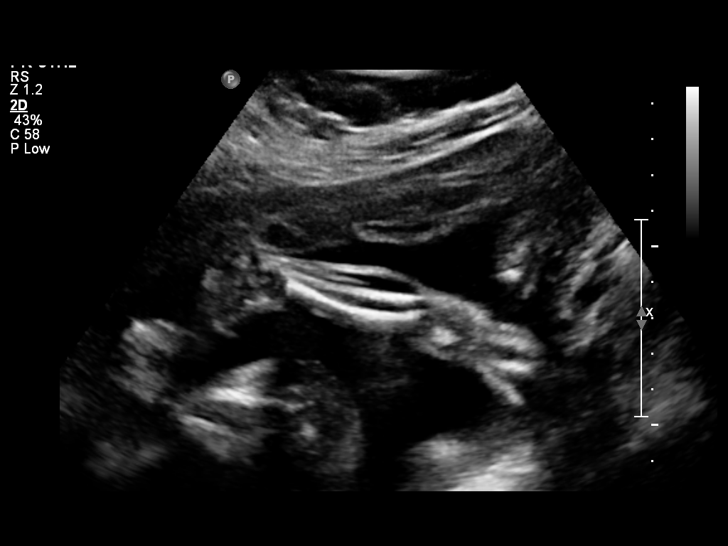
[im 8/65]
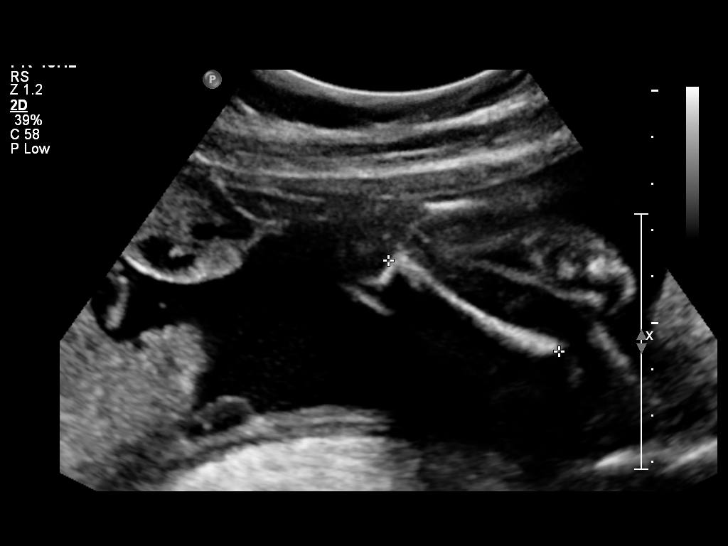
[im 12/65]
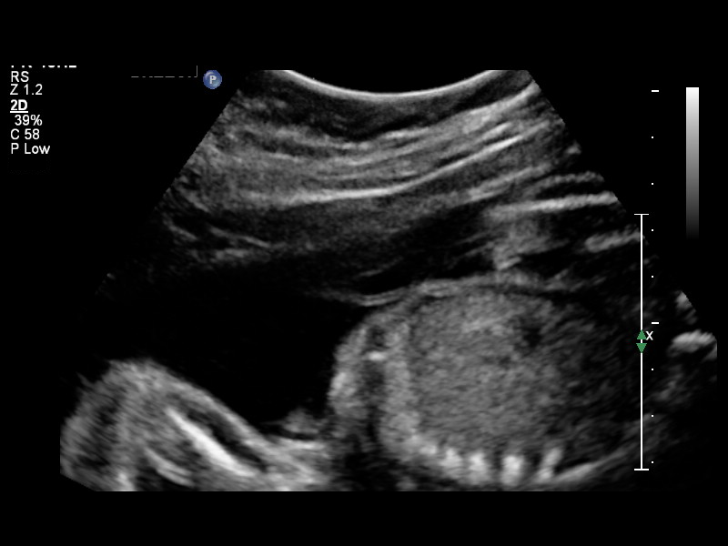
[im 19/65]
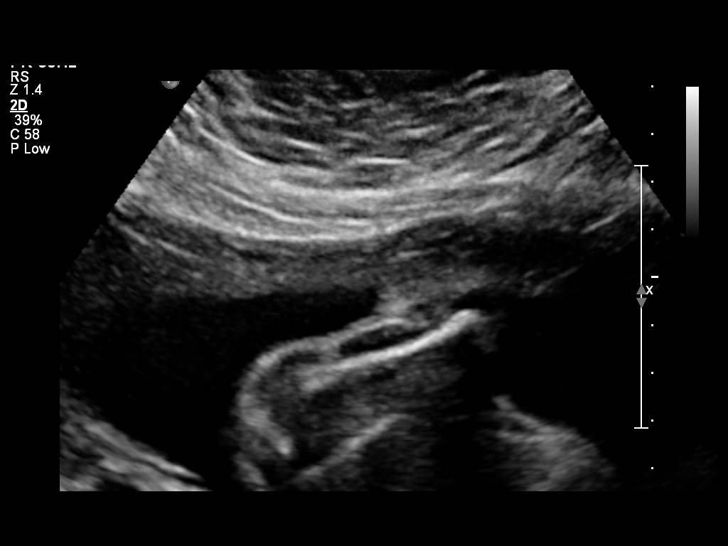
[im 24/65]
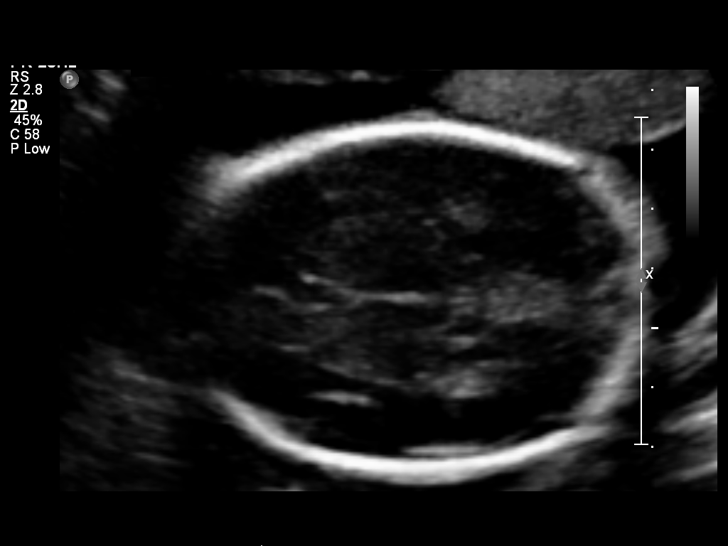
[im 29/65]
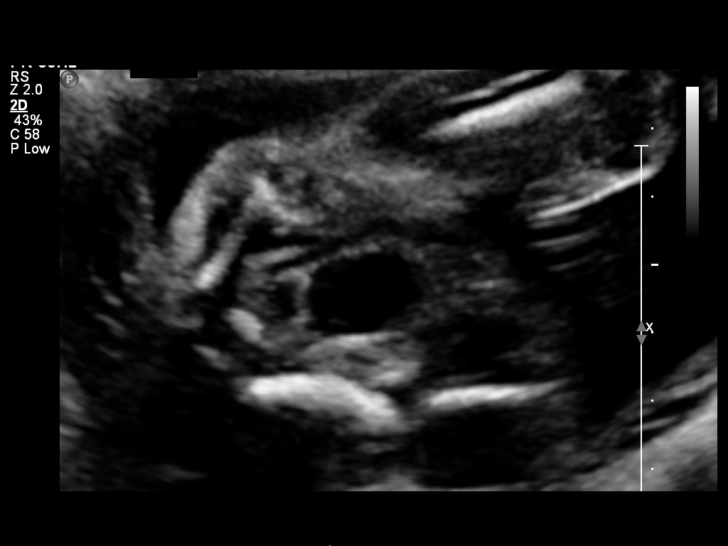
[im 36/65]
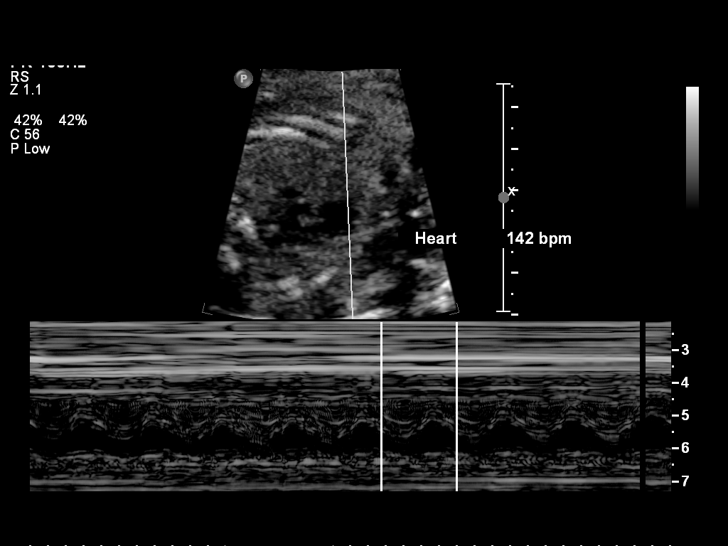
[im 41/65]
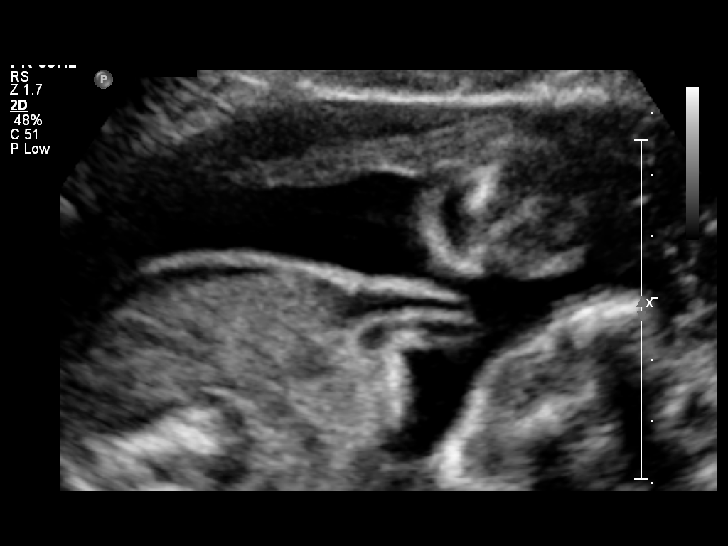
[im 46/65]
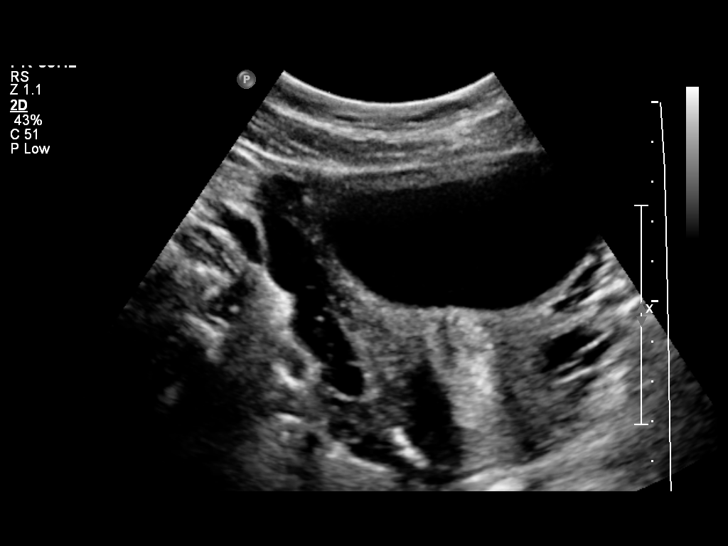
[im 53/65]
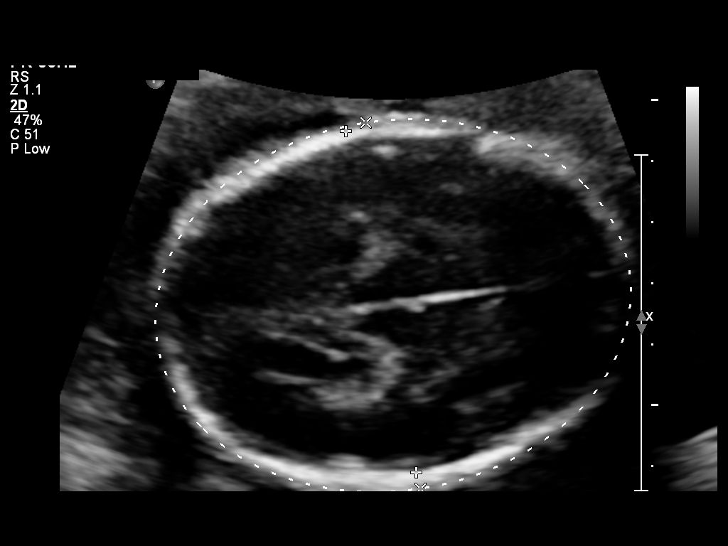
[im 57/65]
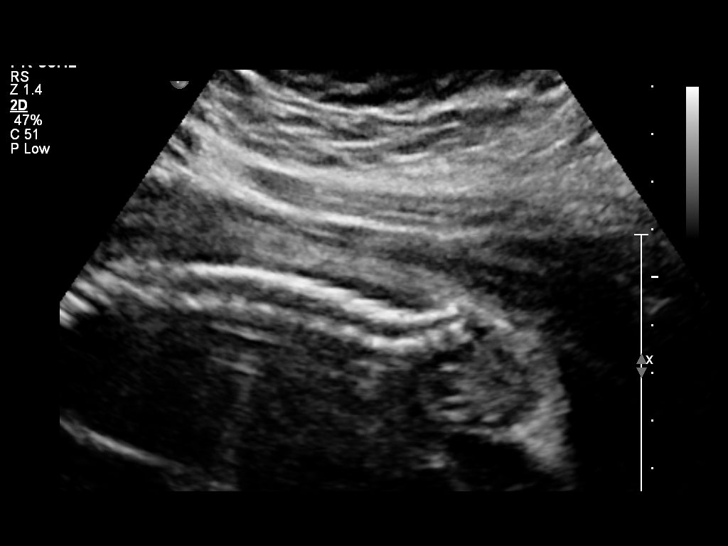
[im 62/65]
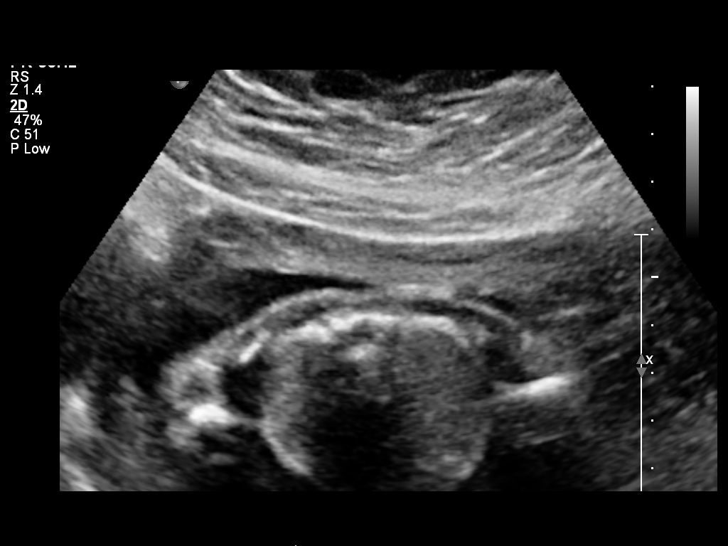

[12 of 28 positions shown; findings below may reference images not displayed]

OBSTETRICS REPORT
                      (Signed Final 03/13/2013 [DATE])

Service(s) Provided

 US OB DETAIL + 14 WK                                  76811.0
Indications

 No or Little Prenatal Care
 Detailed fetal anatomic survey
Fetal Evaluation

 Num Of Fetuses:    1
 Fetal Heart Rate:  142                         bpm
 Cardiac Activity:  Observed
 Presentation:      Breech
 Placenta:          Left lateral, above cervical
                    os
 P. Cord            Visualized, central
 Insertion:

 Amniotic Fluid
 AFI FV:      Subjectively within normal limits
                                             Larg Pckt:   5.55   cm
 RUQ:   5.55   cm
Biometry

 BPD:     57.1  mm    G. Age:   23w 3d                CI:        68.77   70 - 86
                                                      FL/HC:      18.9   18.7 -

 HC:       220  mm    G. Age:   24w 0d       46  %    HC/AC:      1.14   1.05 -

 AC:     192.3  mm    G. Age:   24w 0d       49  %    FL/BPD:     72.7   71 - 87
 FL:      41.5  mm    G. Age:   23w 4d       31  %    FL/AC:      21.6   20 - 24
 HUM:     38.8  mm    G. Age:   23w 6d       43  %

 Est. FW:     626  gm      1 lb 6 oz     53  %
Gestational Age

 U/S Today:     23w 5d                                        EDD:   07/05/13
 Best:          23w 5d    Det. By:   U/S (03/13/13)           EDD:   07/05/13
Anatomy

 Cranium:          Appears normal         Aortic Arch:      Appears normal
 Fetal Cavum:      Appears normal         Ductal Arch:      Appears normal
 Ventricles:       Appears normal         Diaphragm:        Appears normal
 Choroid Plexus:   Appears normal         Stomach:          Appears normal
 Cerebellum:       Appears normal         Abdomen:          Appears normal
 Posterior Fossa:  Appears normal         Abdominal Wall:   Appears nml (cord
                                                            insert, abd wall)
 Nuchal Fold:      Appears normal         Cord Vessels:     Appears normal (3
                                                            vessel cord)
 Face:             Appears normal         Kidneys:          Appear normal
                   (orbits and profile)
 Lips:             Appears normal         Bladder:          Appears normal
 Heart:            Appears normal         Spine:            Appears normal
                   (4CH, axis, and
                   situs)
 RVOT:             Appears normal         Lower             Appears normal
                                          Extremities:
 LVOT:             Appears normal         Upper             Appears normal
                                          Extremities:

 Other:  Fetus appears to be a female. Heels and right 5th digit visualized.
Targeted Anatomy

 Fetal Central Nervous System
 Lat. Ventricles:
Cervix Uterus Adnexa

 Cervical Length:   3.26      cm

 Cervix:       Normal appearance by transabdominal scan.
 Uterus:       No abnormality visualized.
 Cul De Sac:   No free fluid seen.

 Adnexa:     No abnormality visualized.
Impression

 Single live IUP in breech presentation.   81w9d by today's
 exam.
 No anomaly seen in visualized structures as listed above.

 questions or concerns.

## 2014-10-01 ENCOUNTER — Encounter (HOSPITAL_COMMUNITY): Payer: Self-pay | Admitting: Emergency Medicine

## 2015-05-11 IMAGING — CT CT ABD-PELV W/ CM
2 of 4 series · 16 of 46 positions shown, 18 images · IV contrast (Omni 300)
Comparison: None.

CLINICAL DATA: Abdominal pain

EXAM:
CT ABDOMEN AND PELVIS WITH CONTRAST
TECHNIQUE: Multidetector CT imaging of the abdomen and pelvis was performed
using the standard protocol following bolus administration of
intravenous contrast. Oral contrast was also administered.
CONTRAST:  100mL OMNIPAQUE IOHEXOL 300 MG/ML  SOLN

[Series 2: abd/ pelvis 5.0 i30f 1 · axial · 0.82mm/px · z∈[-543,-93]mm · 13 of 98 slices shown, 15 images]
[im 4/98  soft-tissue]
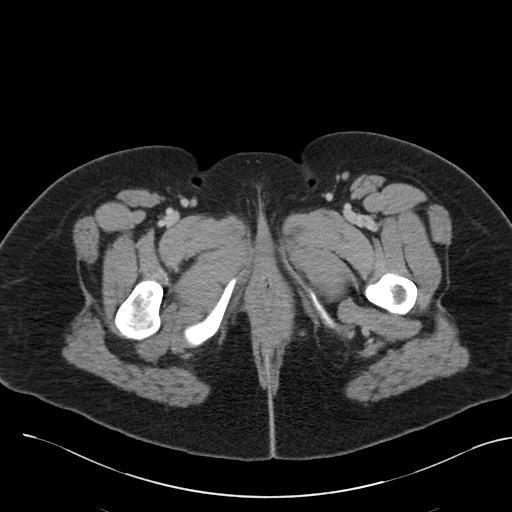
[im 4/98  bone]
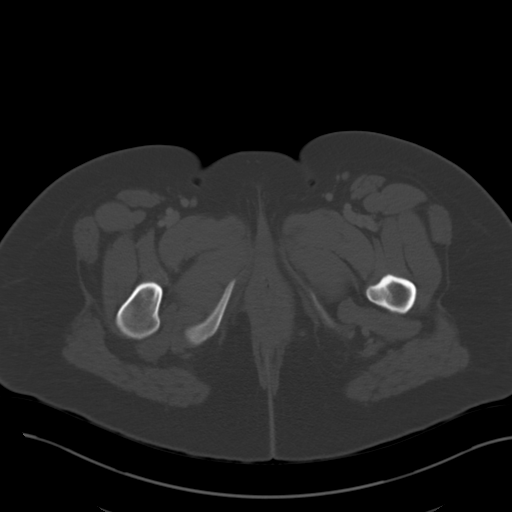
[im 12/98  soft-tissue]
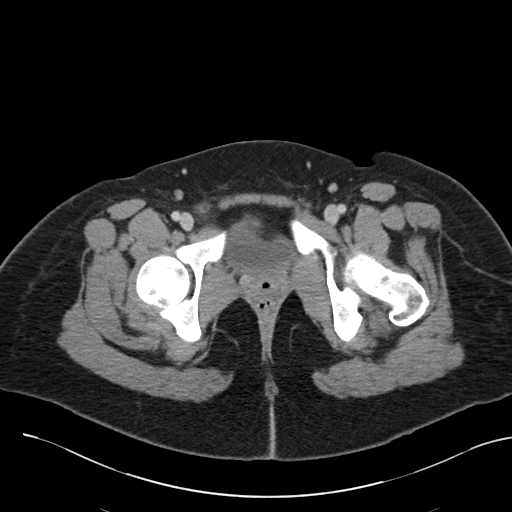
[im 19/98  soft-tissue]
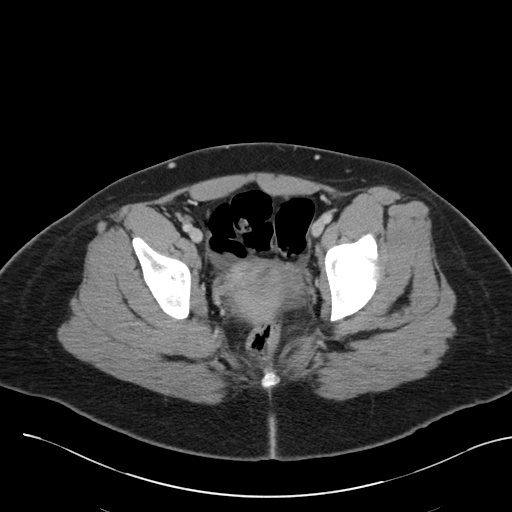
[im 27/98  soft-tissue]
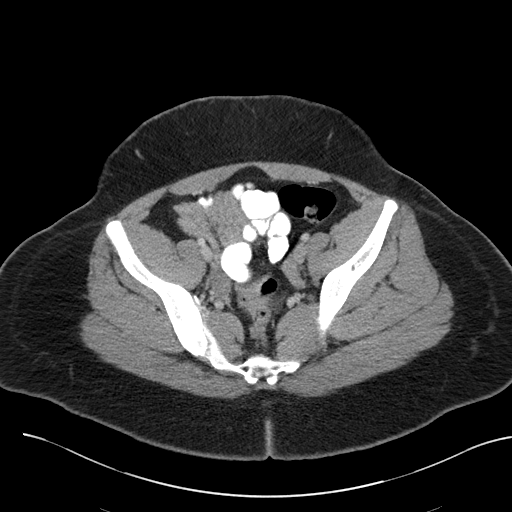
[im 34/98  soft-tissue]
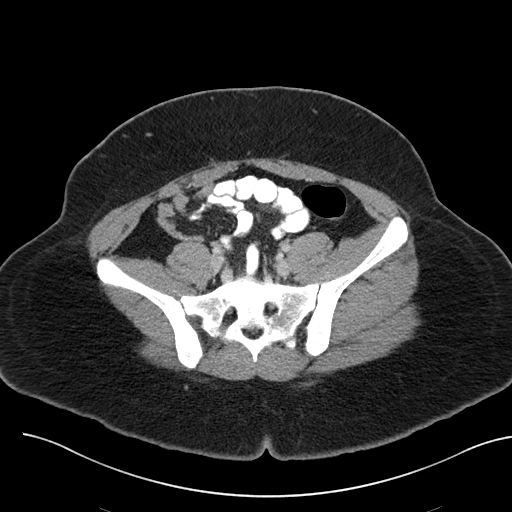
[im 42/98  soft-tissue]
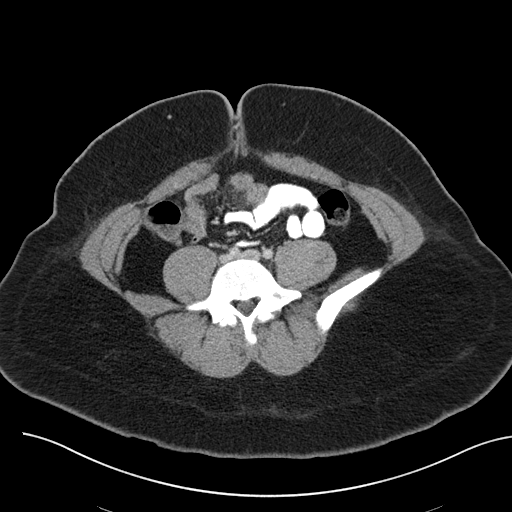
[im 49/98  soft-tissue]
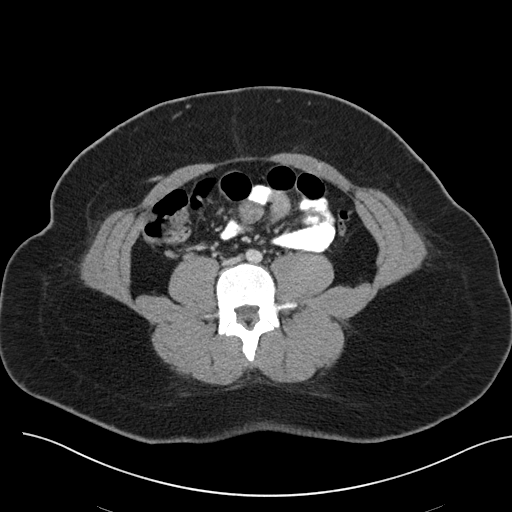
[im 56/98  soft-tissue]
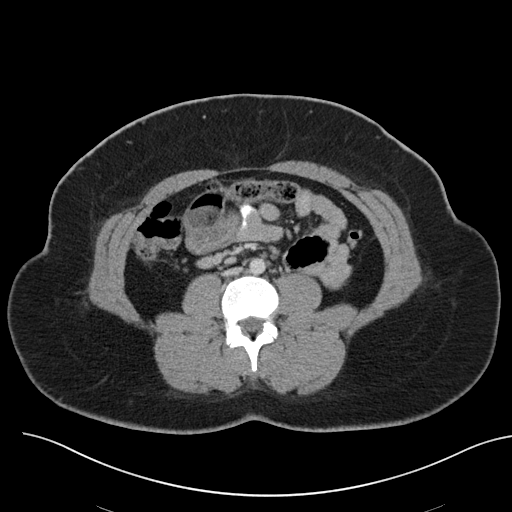
[im 64/98  soft-tissue]
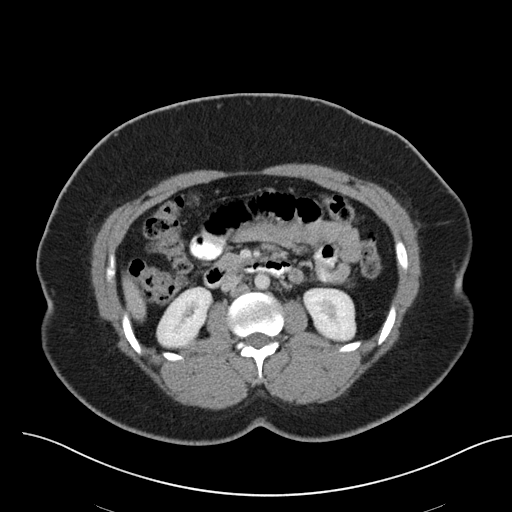
[im 64/98  bone]
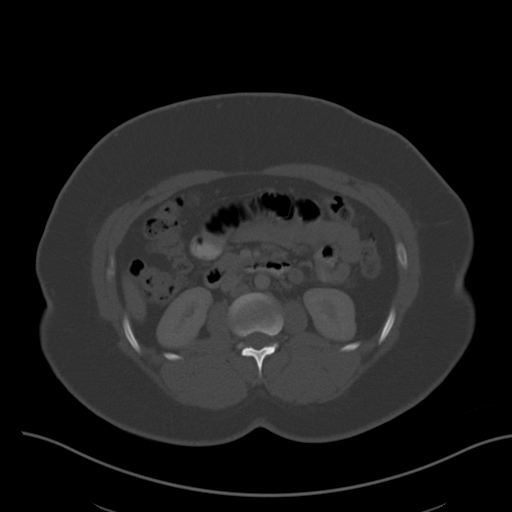
[im 71/98  soft-tissue]
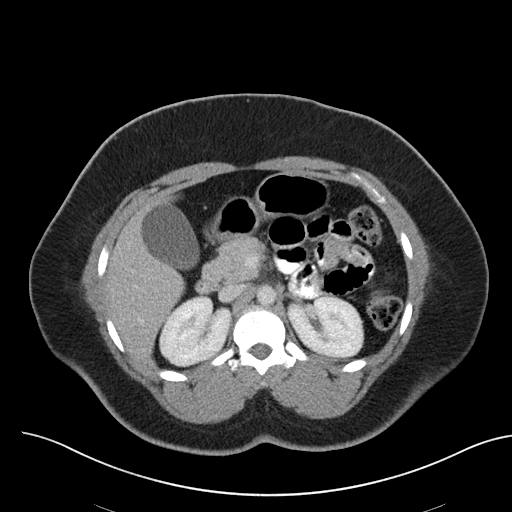
[im 79/98  soft-tissue]
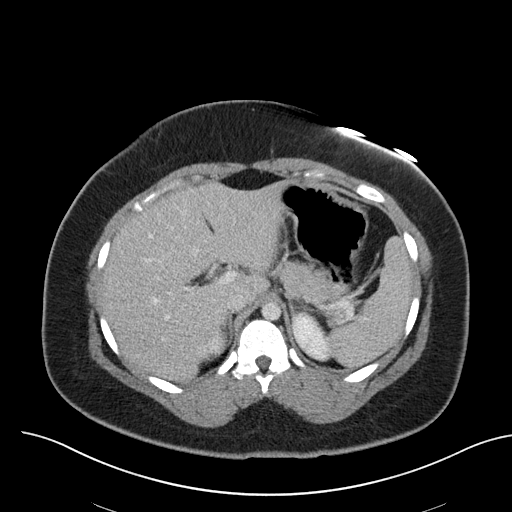
[im 86/98  soft-tissue]
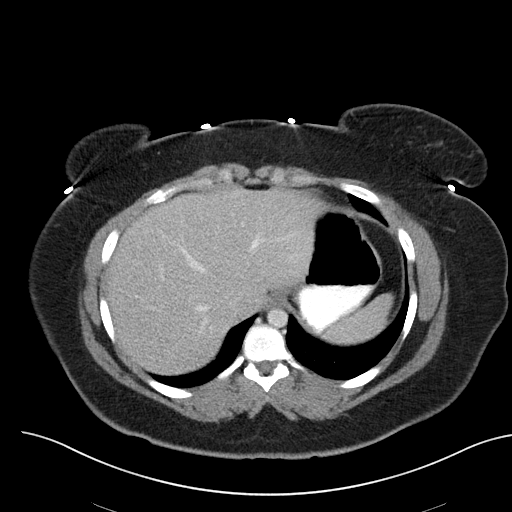
[im 94/98  soft-tissue]
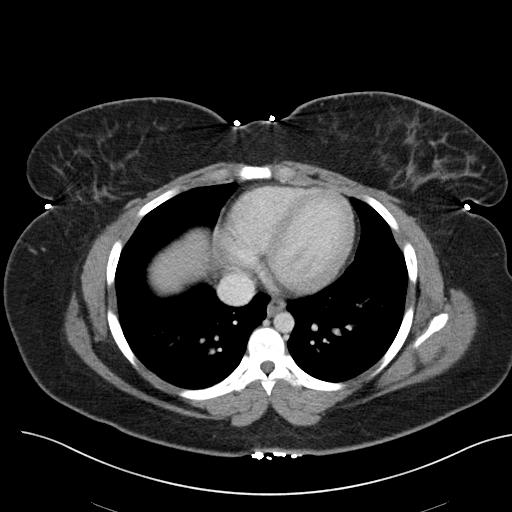

[Series 5: coronals · coronal · 0.68mm/px · 3 of 117 slices shown]
[im 39/117  soft-tissue]
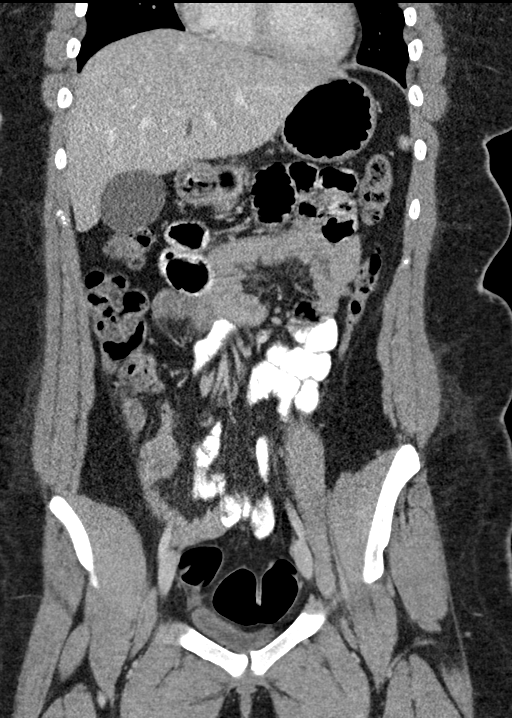
[im 52/117  soft-tissue]
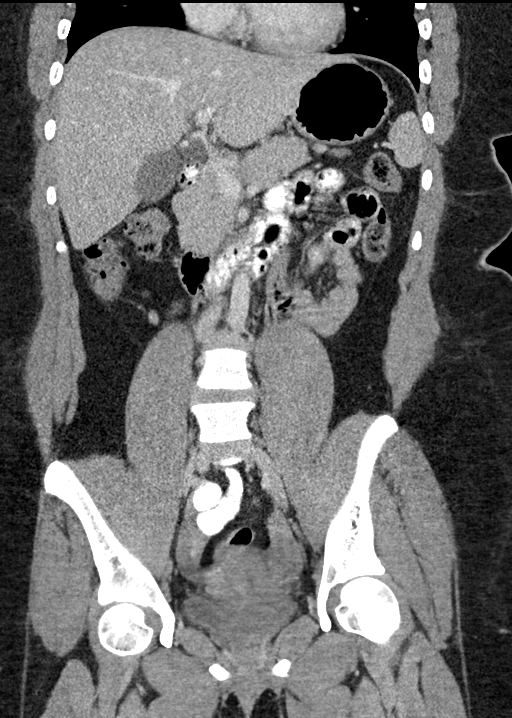
[im 65/117  soft-tissue]
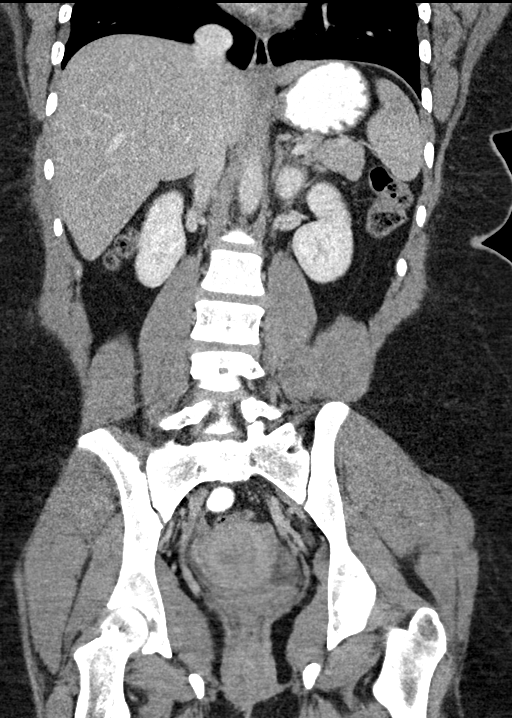

[16 of 46 positions shown; findings below may reference images not displayed]

FINDINGS: Lung bases are clear.

No focal liver lesions are identified. There is no biliary duct
dilatation. Gallbladder wall is not appreciably thickened.

Spleen, pancreas, and adrenals appear normal. Kidneys bilaterally
show no mass or hydronephrosis on either side. There is no renal or
ureteral calculus on either side.

In the pelvis, the urinary bladder is nearly completely empty. There
is no appreciable pelvic mass or fluid. Appendix appears normal.

There is no bowel obstruction.  No free air or portal venous air.

There is a small ventral hernia containing only fat. There is mild
panniculitis in the anterior abdominal wall near the emboli kiss
without mass or abscess. No fluid is seen in this region.

There are multiple subcentimeter lymph nodes in the right mid
abdomen. By size criteria, there is no adenopathy in the abdomen or
pelvis. There is no ascites or abscess in the abdomen or pelvis.
There is no evidence of abdominal aortic aneurysm. There is lumbar
dextroscoliosis. There are no blastic or lytic bone lesions.
IMPRESSION: Small mesenteric lymph nodes in the right abdomen. In the
appropriate clinical setting,1 this finding could indicate a degree
of mesenteric adenitis. By size criteria there is no frank
adenopathy in the abdomen or pelvis.

Appendix appears normal. There is no bowel obstruction. No abscess.
No renal or ureteral calculus. No hydronephrosis. There is a ventral
hernia containing only fat.
